# Patient Record
Sex: Female | Born: 1940 | Hispanic: Yes | Marital: Married | Smoking: Former smoker
Health system: Southern US, Community
[De-identification: ages and names within clinical notes are randomized; demographics above are authoritative.]

## PROBLEM LIST (undated history)

## (undated) DIAGNOSIS — J45909 Unspecified asthma, uncomplicated: Secondary | ICD-10-CM

## (undated) DIAGNOSIS — I10 Essential (primary) hypertension: Secondary | ICD-10-CM

## (undated) DIAGNOSIS — E785 Hyperlipidemia, unspecified: Secondary | ICD-10-CM

## (undated) HISTORY — PX: HERNIA REPAIR: SHX51

---

## 2015-06-01 ENCOUNTER — Emergency Department: Payer: Medicare Other

## 2015-06-01 ENCOUNTER — Emergency Department
Admission: EM | Admit: 2015-06-01 | Discharge: 2015-06-01 | Disposition: A | Discharge status: Home / Self Care | Payer: Medicare Other | Attending: Emergency Medicine | Admitting: Emergency Medicine

## 2015-06-01 ENCOUNTER — Encounter: Payer: Self-pay | Admitting: Emergency Medicine

## 2015-06-01 DIAGNOSIS — I10 Essential (primary) hypertension: Secondary | ICD-10-CM | POA: Diagnosis not present

## 2015-06-01 DIAGNOSIS — Y9389 Activity, other specified: Secondary | ICD-10-CM | POA: Diagnosis not present

## 2015-06-01 DIAGNOSIS — Y998 Other external cause status: Secondary | ICD-10-CM | POA: Diagnosis not present

## 2015-06-01 DIAGNOSIS — S299XXA Unspecified injury of thorax, initial encounter: Secondary | ICD-10-CM | POA: Diagnosis not present

## 2015-06-01 DIAGNOSIS — M545 Low back pain, unspecified: Secondary | ICD-10-CM

## 2015-06-01 DIAGNOSIS — S3992XA Unspecified injury of lower back, initial encounter: Secondary | ICD-10-CM | POA: Insufficient documentation

## 2015-06-01 DIAGNOSIS — Z87891 Personal history of nicotine dependence: Secondary | ICD-10-CM | POA: Insufficient documentation

## 2015-06-01 DIAGNOSIS — S34139A Unspecified injury to sacral spinal cord, initial encounter: Secondary | ICD-10-CM | POA: Diagnosis present

## 2015-06-01 DIAGNOSIS — Y9289 Other specified places as the place of occurrence of the external cause: Secondary | ICD-10-CM | POA: Insufficient documentation

## 2015-06-01 DIAGNOSIS — S3210XA Unspecified fracture of sacrum, initial encounter for closed fracture: Secondary | ICD-10-CM | POA: Insufficient documentation

## 2015-06-01 DIAGNOSIS — W109XXA Fall (on) (from) unspecified stairs and steps, initial encounter: Secondary | ICD-10-CM | POA: Diagnosis not present

## 2015-06-01 HISTORY — DX: Essential (primary) hypertension: I10

## 2015-06-01 MED ORDER — TRAMADOL HCL 50 MG PO TABS
50.0000 mg | ORAL_TABLET | Freq: Once | ORAL | Status: AC
  Administered 2015-06-01: 50 mg via ORAL
  Filled 2015-06-01: qty 1

## 2015-06-01 MED ORDER — TRAMADOL HCL 50 MG PO TABS: 50.0000 mg | ORAL_TABLET | Freq: Four times a day (QID) | ORAL | Status: DC | PRN

## 2015-06-01 NOTE — ED Notes (Signed)
Triage performed with aid of video remote interpreter.

## 2015-06-01 NOTE — ED Notes (Signed)
Pt slipped and fell yesterday on steps.  No loc.  Low back pain.  Pt ambulated in, but very slowly.

## 2015-06-01 NOTE — ED Notes (Signed)
MD at bedside w/ mobile interpreter; pt reports falling at home yesterday down 5 steps; states she slipped across frozen area.  Pt reports lower back pain, and pain across left side; pt with difficulty walking and bending at hips due to pain.

## 2015-06-01 NOTE — ED Notes (Signed)
Patient transported to X-ray 

## 2015-06-01 NOTE — ED Provider Notes (Signed)
[facility] Emergency Department Provider Note  ____________________________________________  Time seen: Approximately 10:38 AM  I have reviewed the triage vital signs and the nursing notes.   HISTORY  Chief Complaint Fall and Back Pain   HPI Jill Abbott is a 74 y.o. female who presents to the emergency department for evaluation of lower back pain after falling down about 5 stairs yesterday morning. No loss of bowel or bladder control.    Past Medical History  Diagnosis Date  . Hypertension     There are no active problems to display for this patient.   Past Surgical History  Procedure Laterality Date  . Hernia repair      Current Outpatient Rx  Name  Route  Sig  Dispense  Refill  . traMADol (ULTRAM) 50 MG tablet   Oral   Take 1 tablet (50 mg total) by mouth every 6 (six) hours as needed.   15 tablet   0     Allergies Review of patient's allergies indicates no known allergies.  No family history on file.  Social History Social History  Substance Use Topics  . Smoking status: Former Games developermoker  . Smokeless tobacco: None  . Alcohol Use: No    Review of Systems Constitutional: No recent illness. Eyes: No visual changes. ENT: No sore throat. Cardiovascular: Denies chest pain or palpitations. Respiratory: Denies shortness of breath. Gastrointestinal: No abdominal pain.  Genitourinary: Negative for dysuria. Musculoskeletal: Pain in lower back and left rib area Skin: Negative for rash. Neurological: Negative for headaches, focal weakness or numbness. 10-point ROS otherwise negative.  ____________________________________________   PHYSICAL EXAM:  VITAL SIGNS: ED Triage Vitals  Enc Vitals Group     BP 06/01/15 1018 151/57 mmHg     Pulse Rate 06/01/15 1018 91     Resp 06/01/15 1018 18     Temp 06/01/15 1018 98.6 F (37 C)     Temp Source 06/01/15 1018 Oral     SpO2 06/01/15 1018 99 %     Weight 06/01/15 1020 104 lb  (47.174 kg)     Height 06/01/15 1020 4' (1.219 m)     Head Cir --      Peak Flow --      Pain Score 06/01/15 1019 9     Pain Loc --      Pain Edu? --      Excl. in GC? --     Constitutional: Alert and oriented. Well appearing and in no acute distress. Eyes: Conjunctivae are normal. EOMI. Head: Atraumatic. Nose: No congestion/rhinnorhea. Neck: No stridor.  Respiratory: Normal respiratory effort.   Musculoskeletal: Tenderness on palpation over coccyx Neurologic:  Normal speech and language. No gross focal neurologic deficits are appreciated. Speech is normal. No gait instability. Skin:  Skin is warm, dry and intact. Atraumatic. Psychiatric: Mood and affect are normal. Speech and behavior are normal.  ____________________________________________   LABS (all labs ordered are listed, but only abnormal results are displayed)  Labs Reviewed - No data to display ____________________________________________  RADIOLOGY  CLINICAL DATA: Fall yesterday down stairs with low back pain, initial encounter  EXAM: SACRUM AND COCCYX - 2+ VIEW  COMPARISON: None.  FINDINGS: The pelvic ring is visualized is intact. The sacral ala are within normal limits. Offset is noted in the distal sacrum consistent with an acute fracture. Correlation to point tenderness is recommended.  IMPRESSION: Offset in the distal sacrum likely related to an acute fracture. Correlation to point tenderness is recommended.  Electronically Signed By: Alcide Clever M.D. On: 06/01/2015 12:14 ____________________________________________   PROCEDURES  Procedure(s) performed: None   ____________________________________________   INITIAL IMPRESSION / ASSESSMENT AND PLAN / ED COURSE  Pertinent labs & imaging results that were available during my care of the patient were reviewed by me and considered in my medical decision making (see chart for details).  Patient was advised to follow-up with the  primary care provider or orthopedics. She was advised to call schedule an appointment. She was advised to return the emergency department for symptoms that change or worsen if she is unable schedule an appointment. ____________________________________________   FINAL CLINICAL IMPRESSION(S) / ED DIAGNOSES  Final diagnoses:  Acute lumbar back pain  Sacral fracture, closed, initial encounter       Chinita Pester, FNP 06/01/15 1552  Emily Filbert, MD 06/01/15 [phone]

## 2015-06-01 NOTE — ED Notes (Signed)
Discharge instructions reviewed by MD with medical interpreter.  Pt and family verbally expressed understanding.

## 2018-02-21 ENCOUNTER — Emergency Department: Payer: Medicare Other

## 2018-02-21 ENCOUNTER — Inpatient Hospital Stay
Admission: EM | Admit: 2018-02-21 | Discharge: 2018-02-22 | DRG: 194 | Disposition: A | Discharge status: Home / Self Care | Payer: Medicare Other | Attending: Internal Medicine | Admitting: Internal Medicine

## 2018-02-21 ENCOUNTER — Other Ambulatory Visit: Payer: Self-pay

## 2018-02-21 DIAGNOSIS — I10 Essential (primary) hypertension: Secondary | ICD-10-CM | POA: Diagnosis not present

## 2018-02-21 DIAGNOSIS — J189 Pneumonia, unspecified organism: Secondary | ICD-10-CM | POA: Diagnosis present

## 2018-02-21 DIAGNOSIS — Z7982 Long term (current) use of aspirin: Secondary | ICD-10-CM | POA: Diagnosis not present

## 2018-02-21 DIAGNOSIS — R042 Hemoptysis: Secondary | ICD-10-CM | POA: Diagnosis not present

## 2018-02-21 DIAGNOSIS — J45909 Unspecified asthma, uncomplicated: Secondary | ICD-10-CM | POA: Diagnosis not present

## 2018-02-21 DIAGNOSIS — J181 Lobar pneumonia, unspecified organism: Secondary | ICD-10-CM | POA: Diagnosis present

## 2018-02-21 DIAGNOSIS — Z79899 Other long term (current) drug therapy: Secondary | ICD-10-CM | POA: Diagnosis not present

## 2018-02-21 DIAGNOSIS — E785 Hyperlipidemia, unspecified: Secondary | ICD-10-CM | POA: Diagnosis present

## 2018-02-21 DIAGNOSIS — Z87891 Personal history of nicotine dependence: Secondary | ICD-10-CM

## 2018-02-21 HISTORY — DX: Unspecified asthma, uncomplicated: J45.909

## 2018-02-21 HISTORY — DX: Hyperlipidemia, unspecified: E78.5

## 2018-02-21 LAB — CBC
HCT: 34.1 % — ABNORMAL LOW (ref 35.0–47.0)
Hemoglobin: 11.3 g/dL — ABNORMAL LOW (ref 12.0–16.0)
MCH: 29.3 pg (ref 26.0–34.0)
MCHC: 33.3 g/dL (ref 32.0–36.0)
MCV: 87.9 fL (ref 80.0–100.0)
PLATELETS: 168 10*3/uL (ref 150–440)
RBC: 3.88 MIL/uL (ref 3.80–5.20)
RDW: 13.8 % (ref 11.5–14.5)
WBC: 8.2 10*3/uL (ref 3.6–11.0)

## 2018-02-21 LAB — BASIC METABOLIC PANEL
Anion gap: 8 (ref 5–15)
BUN: 20 mg/dL (ref 8–23)
CALCIUM: 9.4 mg/dL (ref 8.9–10.3)
CO2: 28 mmol/L (ref 22–32)
CREATININE: 1.07 mg/dL — AB (ref 0.44–1.00)
Chloride: 105 mmol/L (ref 98–111)
GFR, EST AFRICAN AMERICAN: 57 mL/min — AB (ref >60)
GFR, EST NON AFRICAN AMERICAN: 49 mL/min — AB (ref >60)
Glucose, Bld: 98 mg/dL (ref 70–99)
Potassium: 3.8 mmol/L (ref 3.5–5.1)
SODIUM: 141 mmol/L (ref 135–145)

## 2018-02-21 LAB — LACTIC ACID, PLASMA: Lactic Acid, Venous: 0.8 mmol/L (ref 0.5–1.9)

## 2018-02-21 MED ORDER — BENZONATATE 100 MG PO CAPS: 200.0000 mg | ORAL_CAPSULE | Freq: Three times a day (TID) | ORAL | Status: DC | PRN

## 2018-02-21 MED ORDER — ACETAMINOPHEN 650 MG RE SUPP: 650.0000 mg | Freq: Four times a day (QID) | RECTAL | Status: DC | PRN

## 2018-02-21 MED ORDER — ENOXAPARIN SODIUM 40 MG/0.4ML ~~LOC~~ SOLN: 40.0000 mg | SUBCUTANEOUS | Status: DC

## 2018-02-21 MED ORDER — SODIUM CHLORIDE 0.9 % IV SOLN
500.0000 mg | INTRAVENOUS | Status: DC
  Administered 2018-02-21: 500 mg via INTRAVENOUS
  Filled 2018-02-21 (×2): qty 500

## 2018-02-21 MED ORDER — TIOTROPIUM BROMIDE MONOHYDRATE 18 MCG IN CAPS
18.0000 ug | ORAL_CAPSULE | Freq: Every day | RESPIRATORY_TRACT | Status: DC
  Administered 2018-02-22: 18 ug via RESPIRATORY_TRACT
  Filled 2018-02-21: qty 5

## 2018-02-21 MED ORDER — ASPIRIN EC 81 MG PO TBEC: 81.0000 mg | DELAYED_RELEASE_TABLET | Freq: Every day | ORAL | Status: DC

## 2018-02-21 MED ORDER — GUAIFENESIN-DM 100-10 MG/5ML PO SYRP: 5.0000 mL | ORAL_SOLUTION | ORAL | Status: DC | PRN

## 2018-02-21 MED ORDER — PNEUMOCOCCAL VAC POLYVALENT 25 MCG/0.5ML IJ INJ: 0.5000 mL | INJECTION | INTRAMUSCULAR | Status: DC

## 2018-02-21 MED ORDER — ONDANSETRON HCL 4 MG PO TABS: 4.0000 mg | ORAL_TABLET | Freq: Four times a day (QID) | ORAL | Status: DC | PRN

## 2018-02-21 MED ORDER — SODIUM CHLORIDE 0.9 % IV SOLN
INTRAVENOUS | Status: AC
  Administered 2018-02-21 – 2018-02-22 (×2): via INTRAVENOUS

## 2018-02-21 MED ORDER — AMLODIPINE BESYLATE 5 MG PO TABS
5.0000 mg | ORAL_TABLET | Freq: Every day | ORAL | Status: DC
  Administered 2018-02-22: 5 mg via ORAL
  Filled 2018-02-21: qty 1

## 2018-02-21 MED ORDER — ACETAMINOPHEN 325 MG PO TABS: 650.0000 mg | ORAL_TABLET | Freq: Four times a day (QID) | ORAL | Status: DC | PRN

## 2018-02-21 MED ORDER — IPRATROPIUM-ALBUTEROL 0.5-2.5 (3) MG/3ML IN SOLN: 3.0000 mL | RESPIRATORY_TRACT | Status: DC | PRN

## 2018-02-21 MED ORDER — SODIUM CHLORIDE 0.9 % IV BOLUS
1000.0000 mL | Freq: Once | INTRAVENOUS | Status: AC
  Administered 2018-02-21: 1000 mL via INTRAVENOUS

## 2018-02-21 MED ORDER — ATORVASTATIN CALCIUM 20 MG PO TABS
20.0000 mg | ORAL_TABLET | Freq: Every day | ORAL | Status: DC
  Administered 2018-02-22: 20 mg via ORAL
  Filled 2018-02-21: qty 1

## 2018-02-21 MED ORDER — IOPAMIDOL (ISOVUE-370) INJECTION 76%
75.0000 mL | Freq: Once | INTRAVENOUS | Status: AC | PRN
  Administered 2018-02-21: 75 mL via INTRAVENOUS

## 2018-02-21 MED ORDER — ONDANSETRON HCL 4 MG/2ML IJ SOLN: 4.0000 mg | Freq: Four times a day (QID) | INTRAMUSCULAR | Status: DC | PRN

## 2018-02-21 MED ORDER — SODIUM CHLORIDE 0.9 % IV SOLN
2.0000 g | INTRAVENOUS | Status: DC
  Administered 2018-02-21: 2 g via INTRAVENOUS
  Filled 2018-02-21 (×2): qty 20

## 2018-02-21 NOTE — Progress Notes (Addendum)
Patient arrived from ED and ambulated to bed. After about 10 mins she began coughing. She was coughing up blood with clots. Patient denies any shortness of breath or difficulty breathing. Patient sats normal on Ra. Patient also has rash on her back and bilateral legs. Notified MD  Anne HahnWillis of both concerns. No new orders. Family at bedside with patient.

## 2018-02-21 NOTE — H&P (Signed)
[facility] [place] [facility]   PATIENT NAME: Jill Abbott    MR#:  [id number]  DATE OF BIRTH:  09/26/1940  DATE OF ADMISSION:  02/21/2018  PRIMARY CARE PHYSICIAN: Vevelyn RoyalsBiola, Holly, MD   REQUESTING/REFERRING PHYSICIAN: Roxan Hockeyobinson  CHIEF COMPLAINT:   Chief Complaint  Patient presents with  . Hemoptysis    HISTORY OF PRESENT ILLNESS:  Jill Abbott  is a 77 y.o. female who presents with chief complaint as above.  Patient developed increased cough and subsequent hemoptysis within the last day or so.  She has not had any overt fevers, though she does state that she has felt cold for the last couple of days.  Here in the ED she was found to have right-sided pneumonia.  White blood cell count was normal, she is oxygenating well on room air.  However, given her hemoptysis hospitalist were called for admission.  Of note, patient has not had any recent travel or contact with people who have elevated tuberculosis risk, and she herself has had a fairly significant negative work-up in the past, and there was nothing seen on imaging tonight including CT imaging concerning for TB or any other etiology for her hemoptysis other than her lobar pneumonia.  PAST MEDICAL HISTORY:   Past Medical History:  Diagnosis Date  . Asthma   . HLD (hyperlipidemia)   . Hypertension      PAST SURGICAL HISTORY:   Past Surgical History:  Procedure Laterality Date  . HERNIA REPAIR       SOCIAL HISTORY:   Social History   Tobacco Use  . Smoking status: Former Smoker  Substance Use Topics  . Alcohol use: No     FAMILY HISTORY:  Family history reviewed and is non-contributory.   DRUG ALLERGIES:  No Known Allergies  MEDICATIONS AT HOME:   Prior to Admission medications   Medication Sig Start Date End Date Taking? Authorizing Provider  amLODipine (NORVASC) 5 MG tablet Take 5 mg by mouth daily. 01/17/18  Yes [provider]  aspirin 81 MG EC  tablet Take 81 mg by mouth daily. 01/17/18  Yes [provider]  atorvastatin (LIPITOR) 20 MG tablet Take 20 mg by mouth daily. 01/17/18  Yes [provider]  CALCI-CHEW 1250 (500 Ca) MG chewable tablet Chew 1 tablet by mouth 2 (two) times daily. 01/14/18  Yes [provider]  [facility] D3 1000 units capsule Take 1,000 Units by mouth 2 (two) times daily. 01/17/18  Yes [provider]  diclofenac sodium (VOLTAREN) 1 % GEL Apply 2 g topically 4 (four) times daily. 01/09/18  Yes [provider]  SPIRIVA HANDIHALER 18 MCG inhalation capsule Place 18 mcg into inhaler and inhale daily. 02/13/18  Yes [provider]    REVIEW OF SYSTEMS:  Review of Systems  Constitutional: Negative for chills, fever, malaise/fatigue and weight loss.  HENT: Negative for ear pain, hearing loss and tinnitus.   Eyes: Negative for blurred vision, double vision, pain and redness.  Respiratory: Positive for cough and hemoptysis. Negative for shortness of breath.   Cardiovascular: Negative for chest pain, palpitations, orthopnea and leg swelling.  Gastrointestinal: Negative for abdominal pain, constipation, diarrhea, nausea and vomiting.  Genitourinary: Negative for dysuria, frequency and hematuria.  Musculoskeletal: Negative for back pain, joint pain and neck pain.  Skin:       No acne, rash, or lesions  Neurological: Negative for dizziness, tremors, focal weakness and weakness.  Endo/Heme/Allergies: Negative for polydipsia. Does not  bruise/bleed easily.  Psychiatric/Behavioral: Negative for depression. The patient is not nervous/anxious and does not have insomnia.      VITAL SIGNS:   Vitals:   02/21/18 1811 02/21/18 1900 02/21/18 1930 02/21/18 2030  BP: (!) 163/84 (!) 151/79 (!) 149/70 131/66  Pulse: (!) 108 85 (!) 106 81  Resp: (!) 22 20 18  (!) 21  Temp:      TempSrc:      SpO2: 91% 94% 95% 95%  Weight:      Height:       Wt Readings from Last 3 Encounters:  02/21/18  46.3 kg  06/01/15 47.2 kg    PHYSICAL EXAMINATION:  Physical Exam  Vitals reviewed. Constitutional: She is oriented to person, place, and time. She appears well-developed and well-nourished. No distress.  HENT:  Head: Normocephalic and atraumatic.  Mouth/Throat: Oropharynx is clear and moist.  Eyes: Pupils are equal, round, and reactive to light. Conjunctivae and EOM are normal. No scleral icterus.  Neck: Normal range of motion. Neck supple. No JVD present. No thyromegaly present.  Cardiovascular: Normal rate, regular rhythm and intact distal pulses. Exam reveals no gallop and no friction rub.  No murmur heard. Respiratory: Effort normal. No respiratory distress. She has no wheezes. She has no rales.  Right middle lobe rhonchi  GI: Soft. Bowel sounds are normal. She exhibits no distension. There is no tenderness.  Musculoskeletal: Normal range of motion. She exhibits no edema.  No arthritis, no gout  Lymphadenopathy:    She has no cervical adenopathy.  Neurological: She is alert and oriented to person, place, and time. No cranial nerve deficit.  No dysarthria, no aphasia  Skin: Skin is warm and dry. No rash noted. No erythema.  Psychiatric: She has a normal mood and affect. Her behavior is normal. Judgment and thought content normal.    LABORATORY PANEL:   CBC Recent Labs  Lab 02/21/18 1615  WBC 8.2  HGB 11.3*  HCT 34.1*  PLT 168   ------------------------------------------------------------------------------------------------------------------  Chemistries  Recent Labs  Lab 02/21/18 1615  NA 141  K 3.8  CL 105  CO2 28  GLUCOSE 98  BUN 20  CREATININE 1.07*  CALCIUM 9.4   ------------------------------------------------------------------------------------------------------------------  Cardiac Enzymes No results for input(s): TROPONINI in the last 168  hours. ------------------------------------------------------------------------------------------------------------------  RADIOLOGY:  Dg Chest 2 View  Result Date: 02/21/2018 CLINICAL DATA:  Hemoptysis EXAM: CHEST - 2 VIEW COMPARISON:  06/01/2015 FINDINGS: Cardiac shadow is stable. Lungs are well aerated bilaterally. Focal increased density is noted in the right middle lobe consistent with acute pneumonia. No sizable effusion is seen. No bony abnormality is noted. IMPRESSION: Right middle lobe pneumonia. Followup PA and lateral chest X-ray is recommended in 3-4 weeks following trial of antibiotic therapy to ensure resolution and exclude underlying malignancy. Electronically Signed   By: Alcide Clever M.D.   On: 02/21/2018 16:44   Ct Angio Chest Pe W And/or Wo Contrast  Result Date: 02/21/2018 CLINICAL DATA:  Coughing of blood with chest pain. EXAM: CT ANGIOGRAPHY CHEST WITH CONTRAST TECHNIQUE: Multidetector CT imaging of the chest was performed using the standard protocol during bolus administration of intravenous contrast. Multiplanar CT image reconstructions and MIPs were obtained to evaluate the vascular anatomy. CONTRAST:  75mL ISOVUE-370 IOPAMIDOL (ISOVUE-370) INJECTION 76% COMPARISON:  February 21, 2018 chest x-ray FINDINGS: Cardiovascular: Satisfactory opacification of the pulmonary arteries to the segmental level. No evidence of pulmonary embolism. The heart size is mildly enlarged. No pericardial effusion. Mediastinum/Nodes: No enlarged mediastinal, hilar,  or axillary lymph nodes. Thyroid gland, trachea, and esophagus demonstrate no significant findings. Lungs/Pleura: There is right middle lobe pneumonia. Mild atelectasis of anterior left lung base is identified. Small calcified granulomas are identified within the left lung. There is no pleural effusion. Upper Abdomen: There is a hiatal hernia. The other visualized upper abdominal structures are unremarkable. Musculoskeletal: Degenerative joint  changes of the spine are identified. Review of the MIP images confirms the above findings. IMPRESSION: No pulmonary embolus. Right middle lobe pneumonia. Mild atelectasis of anterior left lung base. Electronically Signed   By: Sherian Rein M.D.   On: 02/21/2018 18:15    EKG:  No orders found for this or any previous visit.  IMPRESSION AND PLAN:  Principal Problem:   CAP (community acquired pneumonia) -IV antibiotics started, patient does not meet sepsis criteria, blood culture sent from the ED, other supportive treatment PRN including antitussives and duo nebs if needed Active Problems:   HTN (hypertension) -continue home medications, blood pressure is stable   Asthma -continue home dose inhalers, PRN duo nebs if needed   HLD (hyperlipidemia) -Home dose antilipid  Chart review performed and case discussed with ED provider. Labs, imaging and/or ECG reviewed by provider and discussed with patient/family. Management plans discussed with the patient and/or family.  DVT PROPHYLAXIS: SubQ lovenox   GI PROPHYLAXIS:  None  ADMISSION STATUS: Inpatient     CODE STATUS: Full  TOTAL TIME TAKING CARE OF THIS PATIENT: 45 minutes.   Marquist Binstock FIELDING 02/21/2018, 8:36 PM  [facility]  Office  [phone]  CC: Primary care physician; Vevelyn Royals, MD  Note:  This document was prepared using Dragon voice recognition software and may include unintentional dictation errors.

## 2018-02-21 NOTE — ED Provider Notes (Signed)
[facility] Emergency Department Provider Note    First MD Initiated Contact with Patient 02/21/18 1621     (approximate)  I have reviewed the triage vital signs and the nursing notes.   HISTORY  Chief Complaint Hemoptysis    HPI Jill Abbott is a 77 y.o. female a history of bronchiectasis and previous episodes of hemoptysis presents the ER with fever cough and hemoptysis.  She not on anticoagulation.  No recent travel outside the [place]  No recent antibiotic use.  Patient states that she is been coughing up several clots of blood.  Also has associated right-sided chest pain with this.  Does not wear home oxygen.    Past Medical History:  Diagnosis Date  . Asthma   . Hypertension    No family history on file. Past Surgical History:  Procedure Laterality Date  . HERNIA REPAIR     There are no active problems to display for this patient.     Prior to Admission medications   Medication Sig Start Date End Date Taking? Authorizing Provider  amLODipine (NORVASC) 5 MG tablet Take 5 mg by mouth daily. 01/17/18  Yes [provider]  aspirin 81 MG EC tablet Take 81 mg by mouth daily. 01/17/18  Yes [provider]  atorvastatin (LIPITOR) 20 MG tablet Take 20 mg by mouth daily. 01/17/18  Yes [provider]  CALCI-CHEW 1250 (500 Ca) MG chewable tablet Chew 1 tablet by mouth 2 (two) times daily. 01/14/18  Yes [provider]  [facility] D3 1000 units capsule Take 1,000 Units by mouth 2 (two) times daily. 01/17/18  Yes [provider]  diclofenac sodium (VOLTAREN) 1 % GEL Apply 2 g topically 4 (four) times daily. 01/09/18  Yes [provider]  SPIRIVA HANDIHALER 18 MCG inhalation capsule Place 18 mcg into inhaler and inhale daily. 02/13/18  Yes [provider]    Allergies Patient has no known allergies.    Social History Social History   Tobacco Use  . Smoking status: Former Smoker  Substance Use  Topics  . Alcohol use: No  . Drug use: Not on file    Review of Systems Patient denies headaches, rhinorrhea, blurry vision, numbness, shortness of breath, chest pain, edema, cough, abdominal pain, nausea, vomiting, diarrhea, dysuria, fevers, rashes or hallucinations unless otherwise stated above in HPI. ____________________________________________   PHYSICAL EXAM:  VITAL SIGNS: Vitals:   02/21/18 1900 02/21/18 1930  BP: (!) 151/79 (!) 149/70  Pulse: 85 (!) 106  Resp: 20 18  Temp:    SpO2: 94% 95%    Constitutional: Alert and oriented.  Eyes: Conjunctivae are normal.  Head: Atraumatic. Nose: No congestion/rhinnorhea. Mouth/Throat: Mucous membranes are moist.   Neck: No stridor. Painless ROM.  Cardiovascular: Normal rate, regular rhythm. Grossly normal heart sounds.  Good peripheral circulation. Respiratory: Normal respiratory effort.  No retractions. Lungs CTAB. Gastrointestinal: Soft and nontender. No distention. No abdominal bruits. No CVA tenderness. Genitourinary:  Musculoskeletal: No lower extremity tenderness nor edema.  No joint effusions. Neurologic:  Normal speech and language. No gross focal neurologic deficits are appreciated. No facial droop Skin:  Skin is warm, dry and intact. No rash noted. Psychiatric: Mood and affect are normal. Speech and behavior are normal.  ____________________________________________   LABS (all labs ordered are listed, but only abnormal results are displayed)  Results for orders placed or performed during the hospital encounter of 02/21/18 (from the past 24 hour(s))  CBC     Status:  Abnormal   Collection Time: 02/21/18  4:15 PM  Result Value Ref Range   WBC 8.2 3.6 - 11.0 K/uL   RBC 3.88 3.80 - 5.20 MIL/uL   Hemoglobin 11.3 (L) 12.0 - 16.0 g/dL   HCT 16.1 (L) 09.6 - 04.5 %   MCV 87.9 80.0 - 100.0 fL   MCH 29.3 26.0 - 34.0 pg   MCHC 33.3 32.0 - 36.0 g/dL   RDW 40.9 81.1 - 91.4 %   Platelets 168 150 - 440 K/uL  Basic  metabolic panel     Status: Abnormal   Collection Time: 02/21/18  4:15 PM  Result Value Ref Range   Sodium 141 135 - 145 mmol/L   Potassium 3.8 3.5 - 5.1 mmol/L   Chloride 105 98 - 111 mmol/L   CO2 28 22 - 32 mmol/L   Glucose, Bld 98 70 - 99 mg/dL   BUN 20 8 - 23 mg/dL   Creatinine, Ser 7.82 (H) 0.44 - 1.00 mg/dL   Calcium 9.4 8.9 - 95.6 mg/dL   GFR calc non Af Amer 49 (L) >60 mL/min   GFR calc Af Amer 57 (L) >60 mL/min   Anion gap 8 5 - 15  Lactic acid, plasma     Status: None   Collection Time: 02/21/18  5:09 PM  Result Value Ref Range   Lactic Acid, Venous 0.8 0.5 - 1.9 mmol/L   ____________________________________________ ____________________________________________  RADIOLOGY I personally reviewed all radiographic images ordered to evaluate for the above acute complaints and reviewed radiology reports and findings.  These findings were personally discussed with the patient.  Please see medical record for radiology report.  ____________________________________________   PROCEDURES  Procedure(s) performed:  Procedures    Critical Care performed: no ____________________________________________   INITIAL IMPRESSION / ASSESSMENT AND PLAN / ED COURSE  Pertinent labs & imaging results that were available during my care of the patient were reviewed by me and considered in my medical decision making (see chart for details).   DDX: Asthma, copd, CHF, pna, ptx, malignancy, Pe, anemia   Jill Abbott is a 77 y.o. who presents to the ED with persistent symptoms as described above.  Patient with more complex presentation given history of recurrent hemoptysis he was found to be TB negative as well as evidence of bronchiectasis.  Patient does have remote history of smoking.  She is currently low-grade temperature mildly tachycardic with some mild tachypnea but normotensive.  Will order CT imaging to further evaluate as she does have evidence of right lobe consolidation  given her presentation with hemoptysis certainly concerning for PE versus malignancy.  The patient will be placed on continuous pulse oximetry and telemetry for monitoring.  Laboratory evaluation will be sent to evaluate for the above complaints.     Clinical Course as of Feb 21 2029  Fri Feb 21, 2018  2130 With evidence of consolidation right lobe therefore will start IV antibiotics for community acquired pneumonia.  He had multiple AFB and sputum cultures negative for Tb.  And the amount of hemoptysis as well as mild hypoxia and persistent tachycardia with frailty factors I do believe patient would benefit from hospitalization for further evaluation and management.   [PR]    Clinical Course User Index [PR] Willy Eddy, MD     As part of my medical decision making, I reviewed the following data within the electronic MEDICAL RECORD NUMBER Nursing notes reviewed and incorporated, Labs reviewed, notes from prior ED visits and [place] Controlled  Substance Database   ____________________________________________   FINAL CLINICAL IMPRESSION(S) / ED DIAGNOSES  Final diagnoses:  Community acquired pneumonia of right middle lobe of lung (HCC)  Hemoptysis      NEW MEDICATIONS STARTED DURING THIS VISIT:  New Prescriptions   No medications on file     Note:  This document was prepared using Dragon voice recognition software and may include unintentional dictation errors.    Willy Eddy, MD 02/21/18 2030

## 2018-02-21 NOTE — ED Notes (Addendum)
MD at bedside with update and discussing POC (interpreter used)

## 2018-02-21 NOTE — ED Triage Notes (Signed)
Pt reports that she has been coughing up blood that began today. Bright red bloody sputum with cough.   Interpreter used for triage.   Denies SOB.

## 2018-02-21 NOTE — ED Notes (Signed)
Christina, RN notified that patient in room and placed on airborne precautions at this time. Pt wearing mask.

## 2018-02-21 NOTE — ED Notes (Addendum)
MD at bedside (interpreter used)

## 2018-02-22 DIAGNOSIS — J181 Lobar pneumonia, unspecified organism: Secondary | ICD-10-CM | POA: Diagnosis not present

## 2018-02-22 LAB — BASIC METABOLIC PANEL
Anion gap: 6 (ref 5–15)
BUN: 15 mg/dL (ref 8–23)
CO2: 27 mmol/L (ref 22–32)
CREATININE: 0.82 mg/dL (ref 0.44–1.00)
Calcium: 8.2 mg/dL — ABNORMAL LOW (ref 8.9–10.3)
Chloride: 108 mmol/L (ref 98–111)
GFR calc Af Amer: >60 mL/min (ref >60)
Glucose, Bld: 95 mg/dL (ref 70–99)
POTASSIUM: 3.3 mmol/L — AB (ref 3.5–5.1)
Sodium: 141 mmol/L (ref 135–145)

## 2018-02-22 LAB — CBC
HEMATOCRIT: 30.9 % — AB (ref 35.0–47.0)
Hemoglobin: 10.6 g/dL — ABNORMAL LOW (ref 12.0–16.0)
MCH: 29.8 pg (ref 26.0–34.0)
MCHC: 34.3 g/dL (ref 32.0–36.0)
MCV: 86.7 fL (ref 80.0–100.0)
PLATELETS: 150 10*3/uL (ref 150–440)
RBC: 3.57 MIL/uL — ABNORMAL LOW (ref 3.80–5.20)
RDW: 13.9 % (ref 11.5–14.5)
WBC: 6.5 10*3/uL (ref 3.6–11.0)

## 2018-02-22 MED ORDER — GUAIFENESIN-DM 100-10 MG/5ML PO SYRP: 5.0000 mL | ORAL_SOLUTION | ORAL | 0 refills | Status: DC | PRN

## 2018-02-22 MED ORDER — BENZONATATE 200 MG PO CAPS: 200.0000 mg | ORAL_CAPSULE | Freq: Three times a day (TID) | ORAL | 0 refills | Status: DC | PRN

## 2018-02-22 MED ORDER — LEVOFLOXACIN 500 MG PO TABS: 500.0000 mg | ORAL_TABLET | Freq: Every day | ORAL | 0 refills | Status: AC

## 2018-02-22 MED ORDER — POTASSIUM CHLORIDE CRYS ER 20 MEQ PO TBCR
40.0000 meq | EXTENDED_RELEASE_TABLET | Freq: Once | ORAL | Status: AC
  Administered 2018-02-22: 40 meq via ORAL
  Filled 2018-02-22: qty 2

## 2018-02-22 NOTE — Progress Notes (Signed)
DISCHARGE NOTE:  Pt given discharge instructions and notified prescriptions were [facility] to [facility]. Daughter at bedside. They both verbalized understanding. Pt wheeled to car by volunteers.

## 2018-02-22 NOTE — Discharge Summary (Addendum)
[facility] [place] at [facility]   PATIENT NAME: Jill Abbott    MR#:  [id number]  DATE OF BIRTH:  [birth year]  DATE OF ADMISSION:  02/21/2018 ADMITTING PHYSICIAN: Oralia Manisavid Willis, MD  DATE OF DISCHARGE: 02/22/2018  PRIMARY CARE PHYSICIAN: Vevelyn RoyalsBiola, Holly, MD   ADMISSION DIAGNOSIS:  Hemoptysis [R04.2] Community acquired pneumonia of right middle lobe of lung (HCC) [J18.1] Hyperlipidemia Hypertension DISCHARGE DIAGNOSIS:  Principal Problem:   CAP (community acquired pneumonia) Active Problems:   HTN (hypertension)   HLD (hyperlipidemia)   Asthma   SECONDARY DIAGNOSIS:   Past Medical History:  Diagnosis Date  . Asthma   . HLD (hyperlipidemia)   . Hypertension      ADMITTING HISTORY Jill Abbott  is a 77 y.o. female who presents with chief complaint as above.  Patient developed increased cough and subsequent hemoptysis within the last day or so.  She has not had any overt fevers, though she does state that she has felt cold for the last couple of days.  Here in the ED she was found to have right-sided pneumonia.  White blood cell count was normal, she is oxygenating well on room air.  However, given her hemoptysis hospitalist were called for admission.  Of note, patient has not had any recent travel or contact with people who have elevated tuberculosis risk, and she herself has had a fairly significant negative work-up in the past, and there was nothing seen on imaging tonight including CT imaging concerning for TB or any other etiology for her hemoptysis other than her lobar pneumonia.  HOSPITAL COURSE:  Patient was admitted to medical floor.  She was started on IV Rocephin and Zithromax antibiotics.  Patient tolerated IV antibiotics well.  Her cough and shortness of breath resolved.  She did not have any new episodes of hemoptysis.  Her hemoptysis during presentation could be secondary to pneumonia. no history of any recent travel or sick contacts  with patient with tuberculosis.  Her potassium was low and was supplemented.  Hemoglobin has been stable.  Patient will be discharged home follow-up with primary care physician in the clinic.  She will be discharged home on oral Levaquin antibiotic.  CONSULTS OBTAINED:    DRUG ALLERGIES:  No Known Allergies  DISCHARGE MEDICATIONS:   Allergies as of 02/22/2018   No Known Allergies     Medication List    TAKE these medications   amLODipine 5 MG tablet Commonly known as:  NORVASC Take 5 mg by mouth daily.   aspirin 81 MG EC tablet Take 81 mg by mouth daily.   atorvastatin 20 MG tablet Commonly known as:  LIPITOR Take 20 mg by mouth daily.   benzonatate 200 MG capsule Commonly known as:  TESSALON Take 1 capsule (200 mg total) by mouth 3 (three) times daily as needed for cough.   CALCI-CHEW 1250 (500 Ca) MG chewable tablet Generic drug:  calcium carbonate Chew 1 tablet by mouth 2 (two) times daily.   CVS D3 1000 units capsule Generic drug:  Cholecalciferol Take 1,000 Units by mouth 2 (two) times daily.   diclofenac sodium 1 % Gel Commonly known as:  VOLTAREN Apply 2 g topically 4 (four) times daily.   guaiFENesin-dextromethorphan 100-10 MG/5ML syrup Commonly known as:  ROBITUSSIN DM Take 5 mLs by mouth every 4 (four) hours as needed for cough.   levofloxacin 500 MG tablet Commonly known as:  LEVAQUIN Take 1 tablet (500 mg total) by mouth daily for 5 days.  SPIRIVA HANDIHALER 18 MCG inhalation capsule Generic drug:  tiotropium Place 18 mcg into inhaler and inhale daily.       Today  Patient seen and evaluated today No new episodes of hemoptysis Decreased cough No shortness of breath  VITAL SIGNS:  Blood pressure 121/68, pulse 79, temperature 98.1 F (36.7 C), temperature source Oral, resp. rate 18, height 4\' 11"  (1.499 m), weight 49.9 kg, SpO2 98 %.  I/O:    Intake/Output Summary (Last 24 hours) at 02/22/2018 1122 Last data filed at 02/22/2018  1013 Gross per 24 hour  Intake 1590 ml  Output -  Net 1590 ml    PHYSICAL EXAMINATION:  Physical Exam  GENERAL:  77 y.o.-year-old patient lying in the bed with no acute distress.  LUNGS: Normal breath sounds bilaterally, no wheezing, rales,rhonchi or crepitation. No use of accessory muscles of respiration.  CARDIOVASCULAR: S1, S2 normal. No murmurs, rubs, or gallops.  ABDOMEN: Soft, non-tender, non-distended. Bowel sounds present. No organomegaly or mass.  NEUROLOGIC: Moves all 4 extremities. PSYCHIATRIC: The patient is alert and oriented x 3.  SKIN: No obvious rash, lesion, or ulcer.   DATA REVIEW:   CBC Recent Labs  Lab 02/22/18 0418  WBC 6.5  HGB 10.6*  HCT 30.9*  PLT 150    Chemistries  Recent Labs  Lab 02/22/18 0418  NA 141  K 3.3*  CL 108  CO2 27  GLUCOSE 95  BUN 15  CREATININE 0.82  CALCIUM 8.2*    Cardiac Enzymes No results for input(s): TROPONINI in the last 168 hours.  Microbiology Results  Results for orders placed or performed during the hospital encounter of 02/21/18  Blood Culture (routine x 2)     Status: None (Preliminary result)   Collection Time: 02/21/18  5:09 PM  Result Value Ref Range Status   Specimen Description BLOOD LEFT ANTECUBITAL  Final   Special Requests   Final    BOTTLES DRAWN AEROBIC AND ANAEROBIC Blood Culture adequate volume   Culture   Final    NO GROWTH < 12 HOURS Performed at [facility], [address], [place], [place] [address]    Report Status PENDING  Incomplete  Blood Culture (routine x 2)     Status: None (Preliminary result)   Collection Time: 02/21/18  5:09 PM  Result Value Ref Range Status   Specimen Description BLOOD RIGHT ANTECUBITAL  Final   Special Requests   Final    BOTTLES DRAWN AEROBIC AND ANAEROBIC Blood Culture results may not be optimal due to an inadequate volume of blood received in culture bottles   Culture   Final    NO GROWTH < 12 HOURS Performed at [facility], [address], [place], [place] [address]    Report Status PENDING  Incomplete    RADIOLOGY:  Dg Chest 2 View  Result Date: 02/21/2018 CLINICAL DATA:  Hemoptysis EXAM: CHEST - 2 VIEW COMPARISON:  06/01/2015 FINDINGS: Cardiac shadow is stable. Lungs are well aerated bilaterally. Focal increased density is noted in the right middle lobe consistent with acute pneumonia. No sizable effusion is seen. No bony abnormality is noted. IMPRESSION: Right middle lobe pneumonia. Followup PA and lateral chest X-ray is recommended in 3-4 weeks following trial of antibiotic therapy to ensure resolution and exclude underlying malignancy. Electronically Signed   By: Alcide Clever M.D.   On: 02/21/2018 16:44   Ct Angio Chest Pe W And/or Wo Contrast  Result Date: 02/21/2018 CLINICAL DATA:  Coughing of blood  with chest pain. EXAM: CT ANGIOGRAPHY CHEST WITH CONTRAST TECHNIQUE: Multidetector CT imaging of the chest was performed using the standard protocol during bolus administration of intravenous contrast. Multiplanar CT image reconstructions and MIPs were obtained to evaluate the vascular anatomy. CONTRAST:  75mL ISOVUE-370 IOPAMIDOL (ISOVUE-370) INJECTION 76% COMPARISON:  February 21, 2018 chest x-ray FINDINGS: Cardiovascular: Satisfactory opacification of the pulmonary arteries to the segmental level. No evidence of pulmonary embolism. The heart size is mildly enlarged. No pericardial effusion. Mediastinum/Nodes: No enlarged mediastinal, hilar, or axillary lymph nodes. Thyroid gland, trachea, and esophagus demonstrate no significant findings. Lungs/Pleura: There is right middle lobe pneumonia. Mild atelectasis of anterior left lung base is identified. Small calcified granulomas are identified within the left lung. There is no pleural effusion. Upper Abdomen: There is a hiatal hernia. The other visualized upper abdominal structures are unremarkable. Musculoskeletal: Degenerative joint changes of the spine are identified. Review of  the MIP images confirms the above findings. IMPRESSION: No pulmonary embolus. Right middle lobe pneumonia. Mild atelectasis of anterior left lung base. Electronically Signed   By: Sherian Rein M.D.   On: 02/21/2018 18:15    Follow up with PCP in 1 week.  Management plans discussed with the patient, family and they are in agreement.  CODE STATUS: Full code    Code Status Orders  (From admission, onward)         Start     Ordered   02/21/18 2139  Full code  Continuous     02/21/18 2138        Code Status History    This patient has a current code status but no historical code status.      TOTAL TIME TAKING CARE OF THIS PATIENT ON DAY OF DISCHARGE: more than 34 minutes.   Ihor Austin M.D on 02/22/2018 at 11:22 AM  Between 7am to 6pm - Pager - [phone]  After 6pm go to [email or web address] - password [email or web address]  [facility]  Office  [phone]  CC: Primary care physician; Vevelyn Royals, MD  Note: This dictation was prepared with Dragon dictation along with smaller phrase technology. Any transcriptional errors that result from this process are unintentional.

## 2018-02-26 LAB — CULTURE, BLOOD (ROUTINE X 2)
CULTURE: NO GROWTH
Culture: NO GROWTH
Special Requests: ADEQUATE

## 2018-10-10 ENCOUNTER — Emergency Department: Payer: Medicare Other

## 2018-10-10 ENCOUNTER — Encounter: Payer: Self-pay | Admitting: Emergency Medicine

## 2018-10-10 ENCOUNTER — Inpatient Hospital Stay
Admission: EM | Admit: 2018-10-10 | Discharge: 2018-10-15 | DRG: 439 | Disposition: A | Discharge status: Home / Self Care | Payer: Medicare Other | Attending: Internal Medicine | Admitting: Internal Medicine

## 2018-10-10 ENCOUNTER — Other Ambulatory Visit: Payer: Self-pay

## 2018-10-10 DIAGNOSIS — Z87891 Personal history of nicotine dependence: Secondary | ICD-10-CM

## 2018-10-10 DIAGNOSIS — E876 Hypokalemia: Secondary | ICD-10-CM | POA: Diagnosis present

## 2018-10-10 DIAGNOSIS — N816 Rectocele: Secondary | ICD-10-CM | POA: Diagnosis not present

## 2018-10-10 DIAGNOSIS — J45909 Unspecified asthma, uncomplicated: Secondary | ICD-10-CM | POA: Diagnosis present

## 2018-10-10 DIAGNOSIS — K859 Acute pancreatitis without necrosis or infection, unspecified: Secondary | ICD-10-CM | POA: Diagnosis not present

## 2018-10-10 DIAGNOSIS — Z7951 Long term (current) use of inhaled steroids: Secondary | ICD-10-CM

## 2018-10-10 DIAGNOSIS — R1011 Right upper quadrant pain: Secondary | ICD-10-CM | POA: Diagnosis not present

## 2018-10-10 DIAGNOSIS — R52 Pain, unspecified: Secondary | ICD-10-CM

## 2018-10-10 DIAGNOSIS — K8001 Calculus of gallbladder with acute cholecystitis with obstruction: Secondary | ICD-10-CM

## 2018-10-10 DIAGNOSIS — I10 Essential (primary) hypertension: Secondary | ICD-10-CM | POA: Diagnosis present

## 2018-10-10 DIAGNOSIS — R188 Other ascites: Secondary | ICD-10-CM | POA: Diagnosis present

## 2018-10-10 DIAGNOSIS — E785 Hyperlipidemia, unspecified: Secondary | ICD-10-CM | POA: Diagnosis present

## 2018-10-10 DIAGNOSIS — Z79899 Other long term (current) drug therapy: Secondary | ICD-10-CM

## 2018-10-10 DIAGNOSIS — K4190 Unilateral femoral hernia, without obstruction or gangrene, not specified as recurrent: Secondary | ICD-10-CM | POA: Diagnosis present

## 2018-10-10 DIAGNOSIS — Z7982 Long term (current) use of aspirin: Secondary | ICD-10-CM

## 2018-10-10 DIAGNOSIS — K810 Acute cholecystitis: Secondary | ICD-10-CM | POA: Diagnosis present

## 2018-10-10 LAB — CBC WITH DIFFERENTIAL/PLATELET
Abs Immature Granulocytes: 0.07 10*3/uL (ref 0.00–0.07)
Abs Immature Granulocytes: 0.07 10*3/uL (ref 0.00–0.07)
BASOS ABS: 0 10*3/uL (ref 0.0–0.1)
BASOS PCT: 0 %
Basophils Absolute: 0 10*3/uL (ref 0.0–0.1)
Basophils Relative: 0 %
Eosinophils Absolute: 0 10*3/uL (ref 0.0–0.5)
Eosinophils Absolute: 0 10*3/uL (ref 0.0–0.5)
Eosinophils Relative: 0 %
Eosinophils Relative: 0 %
HCT: 39.5 % (ref 36.0–46.0)
HCT: 40.2 % (ref 36.0–46.0)
Hemoglobin: 13.1 g/dL (ref 12.0–15.0)
Hemoglobin: 13.3 g/dL (ref 12.0–15.0)
Immature Granulocytes: 0 %
Immature Granulocytes: 0 %
Lymphocytes Relative: 2 %
Lymphocytes Relative: 4 %
Lymphs Abs: 0.4 10*3/uL — ABNORMAL LOW (ref 0.7–4.0)
Lymphs Abs: 0.7 10*3/uL (ref 0.7–4.0)
MCH: 27.9 pg (ref 26.0–34.0)
MCH: 28.1 pg (ref 26.0–34.0)
MCHC: 33.2 g/dL (ref 30.0–36.0)
MCHC: 33.1 g/dL (ref 30.0–36.0)
MCV: 84.2 fL (ref 80.0–100.0)
MCV: 84.8 fL (ref 80.0–100.0)
Monocytes Absolute: 0.9 10*3/uL (ref 0.1–1.0)
Monocytes Absolute: 0.7 10*3/uL (ref 0.1–1.0)
Monocytes Relative: 5 %
Monocytes Relative: 4 %
NRBC: 0 % (ref 0.0–0.2)
NRBC: 0 % (ref 0.0–0.2)
Neutro Abs: 16.9 10*3/uL — ABNORMAL HIGH (ref 1.7–7.7)
Neutro Abs: 15.6 10*3/uL — ABNORMAL HIGH (ref 1.7–7.7)
Neutrophils Relative %: 93 %
Neutrophils Relative %: 92 %
Platelets: 243 10*3/uL (ref 150–400)
Platelets: 232 10*3/uL (ref 150–400)
RBC: 4.69 MIL/uL (ref 3.87–5.11)
RBC: 4.74 MIL/uL (ref 3.87–5.11)
RDW: 13.1 % (ref 11.5–15.5)
RDW: 13.2 % (ref 11.5–15.5)
WBC: 18.3 10*3/uL — AB (ref 4.0–10.5)
WBC: 17 10*3/uL — AB (ref 4.0–10.5)

## 2018-10-10 LAB — LIPASE, BLOOD
Lipase: 2699 U/L — ABNORMAL HIGH (ref 11–51)
Lipase: 1286 U/L — ABNORMAL HIGH (ref 11–51)

## 2018-10-10 LAB — LIPID PANEL
Cholesterol: 104 mg/dL (ref 0–200)
HDL: 59 mg/dL (ref >40)
LDL Cholesterol: 38 mg/dL (ref 0–99)
Total CHOL/HDL Ratio: 1.8 RATIO
Triglycerides: 35 mg/dL (ref <150)
VLDL: 7 mg/dL (ref 0–40)

## 2018-10-10 LAB — URINALYSIS, COMPLETE (UACMP) WITH MICROSCOPIC
Bilirubin Urine: NEGATIVE
Glucose, UA: 50 mg/dL — AB
Hgb urine dipstick: NEGATIVE
Ketones, ur: NEGATIVE mg/dL
Leukocytes,Ua: NEGATIVE
Nitrite: NEGATIVE
Protein, ur: 100 mg/dL — AB
Specific Gravity, Urine: 1.023 (ref 1.005–1.030)
pH: 5 (ref 5.0–8.0)

## 2018-10-10 LAB — COMPREHENSIVE METABOLIC PANEL
ALT: 196 U/L — ABNORMAL HIGH (ref 0–44)
ALT: 141 U/L — ABNORMAL HIGH (ref 0–44)
AST: 307 U/L — ABNORMAL HIGH (ref 15–41)
AST: 158 U/L — ABNORMAL HIGH (ref 15–41)
Albumin: 4.1 g/dL (ref 3.5–5.0)
Albumin: 3.5 g/dL (ref 3.5–5.0)
Alkaline Phosphatase: 100 U/L (ref 38–126)
Alkaline Phosphatase: 82 U/L (ref 38–126)
Anion gap: 11 (ref 5–15)
Anion gap: 9 (ref 5–15)
BUN: 30 mg/dL — ABNORMAL HIGH (ref 8–23)
BUN: 25 mg/dL — ABNORMAL HIGH (ref 8–23)
CO2: 27 mmol/L (ref 22–32)
CO2: 26 mmol/L (ref 22–32)
Calcium: 8.9 mg/dL (ref 8.9–10.3)
Calcium: 7.4 mg/dL — ABNORMAL LOW (ref 8.9–10.3)
Chloride: 99 mmol/L (ref 98–111)
Chloride: 103 mmol/L (ref 98–111)
Creatinine, Ser: 1.15 mg/dL — ABNORMAL HIGH (ref 0.44–1.00)
Creatinine, Ser: 0.96 mg/dL (ref 0.44–1.00)
GFR calc Af Amer: 54 mL/min — ABNORMAL LOW (ref >60)
GFR calc Af Amer: >60 mL/min (ref >60)
GFR calc non Af Amer: 47 mL/min — ABNORMAL LOW (ref >60)
GFR, EST NON AFRICAN AMERICAN: 58 mL/min — AB (ref >60)
Glucose, Bld: 229 mg/dL — ABNORMAL HIGH (ref 70–99)
Glucose, Bld: 162 mg/dL — ABNORMAL HIGH (ref 70–99)
POTASSIUM: 3.1 mmol/L — AB (ref 3.5–5.1)
Potassium: 3.6 mmol/L (ref 3.5–5.1)
Sodium: 137 mmol/L (ref 135–145)
Sodium: 138 mmol/L (ref 135–145)
Total Bilirubin: 0.8 mg/dL (ref 0.3–1.2)
Total Bilirubin: 0.6 mg/dL (ref 0.3–1.2)
Total Protein: 8.6 g/dL — ABNORMAL HIGH (ref 6.5–8.1)
Total Protein: 7.4 g/dL (ref 6.5–8.1)

## 2018-10-10 LAB — MAGNESIUM: Magnesium: 2.1 mg/dL (ref 1.7–2.4)

## 2018-10-10 LAB — LACTIC ACID, PLASMA
Lactic Acid, Venous: 2.5 mmol/L (ref 0.5–1.9)
Lactic Acid, Venous: 1.5 mmol/L (ref 0.5–1.9)

## 2018-10-10 MED ORDER — BENZONATATE 100 MG PO CAPS: 200.0000 mg | ORAL_CAPSULE | Freq: Three times a day (TID) | ORAL | Status: DC | PRN

## 2018-10-10 MED ORDER — IOHEXOL 300 MG/ML  SOLN
75.0000 mL | Freq: Once | INTRAMUSCULAR | Status: AC | PRN
  Administered 2018-10-10: 75 mL via INTRAVENOUS

## 2018-10-10 MED ORDER — ONDANSETRON HCL 4 MG PO TABS: 4.0000 mg | ORAL_TABLET | Freq: Four times a day (QID) | ORAL | Status: DC | PRN

## 2018-10-10 MED ORDER — TIOTROPIUM BROMIDE MONOHYDRATE 18 MCG IN CAPS
18.0000 ug | ORAL_CAPSULE | Freq: Every day | RESPIRATORY_TRACT | Status: DC
  Administered 2018-10-10 – 2018-10-14 (×5): 18 ug via RESPIRATORY_TRACT
  Filled 2018-10-10: qty 5

## 2018-10-10 MED ORDER — SODIUM CHLORIDE 0.9 % IV SOLN
INTRAVENOUS | Status: DC
  Administered 2018-10-10 – 2018-10-13 (×8): via INTRAVENOUS

## 2018-10-10 MED ORDER — MORPHINE SULFATE (PF) 2 MG/ML IV SOLN
2.0000 mg | Freq: Once | INTRAVENOUS | Status: AC
  Administered 2018-10-10: 2 mg via INTRAVENOUS
  Filled 2018-10-10: qty 1

## 2018-10-10 MED ORDER — ONDANSETRON HCL 4 MG/2ML IJ SOLN
4.0000 mg | Freq: Four times a day (QID) | INTRAMUSCULAR | Status: DC | PRN
  Administered 2018-10-15: 4 mg via INTRAVENOUS
  Filled 2018-10-10: qty 2

## 2018-10-10 MED ORDER — MORPHINE SULFATE (PF) 2 MG/ML IV SOLN
2.0000 mg | INTRAVENOUS | Status: DC | PRN
  Administered 2018-10-10 – 2018-10-11 (×4): 2 mg via INTRAVENOUS
  Filled 2018-10-10 (×4): qty 1

## 2018-10-10 MED ORDER — MAGNESIUM HYDROXIDE 400 MG/5ML PO SUSP
30.0000 mL | Freq: Every day | ORAL | Status: DC | PRN
  Filled 2018-10-10: qty 30

## 2018-10-10 MED ORDER — GUAIFENESIN-DM 100-10 MG/5ML PO SYRP
5.0000 mL | ORAL_SOLUTION | ORAL | Status: DC | PRN
  Filled 2018-10-10: qty 5

## 2018-10-10 MED ORDER — SODIUM CHLORIDE 0.9 % IV SOLN
1.0000 g | INTRAVENOUS | Status: DC
  Administered 2018-10-10: 1 g via INTRAVENOUS
  Filled 2018-10-10: qty 10

## 2018-10-10 MED ORDER — SODIUM CHLORIDE 0.9 % IV BOLUS
1000.0000 mL | Freq: Once | INTRAVENOUS | Status: AC
  Administered 2018-10-10: 1000 mL via INTRAVENOUS

## 2018-10-10 MED ORDER — SODIUM CHLORIDE 0.9 % IV SOLN
INTRAVENOUS | Status: DC | PRN
  Administered 2018-10-10 – 2018-10-13 (×3): 250 mL via INTRAVENOUS

## 2018-10-10 MED ORDER — PIPERACILLIN-TAZOBACTAM 3.375 G IVPB 30 MIN
3.3750 g | Freq: Once | INTRAVENOUS | Status: AC
  Administered 2018-10-10: 3.375 g via INTRAVENOUS
  Filled 2018-10-10: qty 50

## 2018-10-10 MED ORDER — PIPERACILLIN-TAZOBACTAM 3.375 G IVPB 30 MIN: 3.3750 g | Freq: Four times a day (QID) | INTRAVENOUS | Status: DC

## 2018-10-10 MED ORDER — HEPARIN SODIUM (PORCINE) 5000 UNIT/ML IJ SOLN
5000.0000 [IU] | Freq: Three times a day (TID) | INTRAMUSCULAR | Status: DC
  Administered 2018-10-10 – 2018-10-15 (×14): 5000 [IU] via SUBCUTANEOUS
  Filled 2018-10-10 (×14): qty 1

## 2018-10-10 MED ORDER — PIPERACILLIN-TAZOBACTAM 3.375 G IVPB
3.3750 g | Freq: Three times a day (TID) | INTRAVENOUS | Status: DC
  Administered 2018-10-10 – 2018-10-13 (×9): 3.375 g via INTRAVENOUS
  Filled 2018-10-10 (×9): qty 50

## 2018-10-10 MED ORDER — AMLODIPINE BESYLATE 5 MG PO TABS
5.0000 mg | ORAL_TABLET | Freq: Every day | ORAL | Status: DC
  Administered 2018-10-10 – 2018-10-15 (×5): 5 mg via ORAL
  Filled 2018-10-10 (×6): qty 1

## 2018-10-10 MED ORDER — POTASSIUM CHLORIDE CRYS ER 20 MEQ PO TBCR
40.0000 meq | EXTENDED_RELEASE_TABLET | Freq: Two times a day (BID) | ORAL | Status: DC
  Administered 2018-10-10 – 2018-10-15 (×9): 40 meq via ORAL
  Filled 2018-10-10 (×10): qty 2

## 2018-10-10 MED ORDER — ENOXAPARIN SODIUM 40 MG/0.4ML ~~LOC~~ SOLN: 40.0000 mg | SUBCUTANEOUS | Status: DC

## 2018-10-10 MED ORDER — KETOROLAC TROMETHAMINE 30 MG/ML IJ SOLN
15.0000 mg | Freq: Four times a day (QID) | INTRAMUSCULAR | Status: DC | PRN
  Administered 2018-10-10 – 2018-10-11 (×3): 15 mg via INTRAVENOUS
  Filled 2018-10-10 (×3): qty 1

## 2018-10-10 MED ORDER — ACETAMINOPHEN 650 MG RE SUPP: 650.0000 mg | Freq: Four times a day (QID) | RECTAL | Status: DC | PRN

## 2018-10-10 MED ORDER — SODIUM CHLORIDE 0.9 % IV SOLN: 500.0000 mg | INTRAVENOUS | Status: DC

## 2018-10-10 MED ORDER — POTASSIUM CHLORIDE CRYS ER 20 MEQ PO TBCR
40.0000 meq | EXTENDED_RELEASE_TABLET | Freq: Once | ORAL | Status: DC
  Filled 2018-10-10: qty 2

## 2018-10-10 MED ORDER — OXYCODONE HCL 5 MG PO TABS
5.0000 mg | ORAL_TABLET | ORAL | Status: DC | PRN
  Administered 2018-10-10 – 2018-10-14 (×5): 5 mg via ORAL
  Filled 2018-10-10 (×6): qty 1

## 2018-10-10 MED ORDER — TRAZODONE HCL 50 MG PO TABS: 25.0000 mg | ORAL_TABLET | Freq: Every evening | ORAL | Status: DC | PRN

## 2018-10-10 MED ORDER — ONDANSETRON HCL 4 MG/2ML IJ SOLN
4.0000 mg | Freq: Once | INTRAMUSCULAR | Status: AC
  Administered 2018-10-10: 4 mg via INTRAVENOUS
  Filled 2018-10-10: qty 2

## 2018-10-10 MED ORDER — IOPAMIDOL (ISOVUE-300) INJECTION 61%: 75.0000 mL | Freq: Once | INTRAVENOUS | Status: DC | PRN

## 2018-10-10 MED ORDER — ACETAMINOPHEN 325 MG PO TABS: 650.0000 mg | ORAL_TABLET | Freq: Four times a day (QID) | ORAL | Status: DC | PRN

## 2018-10-10 NOTE — H&P (Signed)
[place] [facility] [place] NAME: Jill Abbott    MR#:  [id number]  DATE OF BIRTH:  [birth year]  DATE OF ADMISSION:  10/10/2018  PRIMARY CARE PHYSICIAN: Albertina Parr, MD   REQUESTING/REFERRING PHYSICIAN: Marjean Donna, MD  CHIEF COMPLAINT:   Chief Complaint  Patient presents with   Abdominal Pain   The patient is Spanish-speaking.  Translation was provided by an ER staff but is fluent in [place]. HISTORY OF PRESENT ILLNESS:  Jill Abbott  is a 78 y.o. female with a known history of hypertension, asthma and dyslipidemia, who presented to the emergency room with acute onset of upper abdominal pain including her right upper quadrant mainly and to less extent left upper quadrant with no nausea or vomiting.  She denied any fever or chills she denied any dysuria, oliguria or hematuria or flank pain.  She admitted to cough and occasional wheezing.  She attributes the cough to allergies.  No rhinorrhea or nasal congestion or sore throat.  Upon presentation to the emergency room, her heart rate was 103 and she was afebrile with otherwise normal vital signs.  Labs were remarkable for hypokalemia of 3.1 with a blood glucose of 229 and creatinine 1.15.  Her serum lipase was 2699 and AST 307 with ALT of 196 troponin of 8.6 with albumin of 4.1.  Lactic acid was 2.5 and later came down to 1.5.  CBC showed leukocytosis of 18.3 with neutrophilia.  Urinalysis showed 100 protein with otherwise unremarkable findings.  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  Right upper quadrant ultrasound revealed thickened and edematous gallbladder wall but may reflect acute cholecystitis with no documented calculi.  There was small volume ascites in the upper quadrants that is usually not encountered with uncomplicated  cholecystitis.  The patient was given IV Rocephin and morphine sulfate as well as Zofran.  I recommended IV Zosyn and we added Zithromax given her lung findings.  She will be admitted to medical monitored bed for further evaluation and management. PAST MEDICAL HISTORY:   Past Medical History:  Diagnosis Date   Asthma    HLD (hyperlipidemia)    Hypertension     PAST SURGICAL HISTORY:   Past Surgical History:  Procedure Laterality Date   HERNIA REPAIR      SOCIAL HISTORY:   Social History   Tobacco Use   Smoking status: Former Smoker    Last attempt to quit: 07/16/2014    Years since quitting: 4.2   Smokeless tobacco: Never Used  Substance Use Topics   Alcohol use: No    FAMILY HISTORY:  No family history on file.  She denied any familial diseases.  DRUG ALLERGIES:  No Known Allergies  REVIEW OF SYSTEMS:   ROS As per history of present illness. All pertinent systems were reviewed above. Constitutional,  HEENT, cardiovascular, respiratory, GI, GU, musculoskeletal, neuro, psychiatric, endocrine,  integumentary and hematologic systems were reviewed and are otherwise  negative/unremarkable except for positive findings mentioned above in the HPI.   MEDICATIONS AT HOME:   Prior to Admission medications   Medication Sig Start Date End Date Taking? Authorizing Provider  amLODipine (NORVASC) 5 MG tablet Take 5 mg by mouth daily. 01/17/18  Yes [provider]  aspirin 81 MG EC tablet Take 81 mg by mouth daily. 01/17/18  Yes [provider]  atorvastatin (LIPITOR) 20 MG tablet Take 20 mg by mouth daily. 01/17/18  Yes [provider]  CALCI-CHEW 1250 (500 Ca) MG chewable tablet Chew 1 tablet by mouth 2 (two) times daily. 01/14/18  Yes [provider]  CVS D3 1000 units capsule Take 1,000 Units by mouth 2 (two) times daily. 01/17/18  Yes [provider]  diclofenac sodium (VOLTAREN) 1 % GEL Apply 2 g topically 4 (four) times daily.  01/09/18  Yes [provider]  SPIRIVA HANDIHALER 18 MCG inhalation capsule Place 18 mcg into inhaler and inhale daily. 02/13/18  Yes [provider]  triamterene-hydrochlorothiazide (MAXZIDE-25) 37.5-25 MG tablet Take 1 tablet by mouth daily. 08/18/18 08/18/19 Yes [provider]  Trolamine Salicylate 10 % LOTN Apply to painful area twice a day 08/18/18  Yes [provider]  benzonatate (TESSALON) 200 MG capsule Take 1 capsule (200 mg total) by mouth 3 (three) times daily as needed for cough. Patient not taking: Reported on 10/10/2018 02/22/18   Saundra Shelling, MD  guaiFENesin-dextromethorphan (ROBITUSSIN DM) 100-10 MG/5ML syrup Take 5 mLs by mouth every 4 (four) hours as needed for cough. Patient not taking: Reported on 10/10/2018 02/22/18   Saundra Shelling, MD      VITAL SIGNS:  Blood pressure 129/66, pulse 95, temperature 98.7 F (37.1 C), temperature source Oral, resp. rate 17, height 5' (1.524 m), weight 48.5 kg, SpO2 96 %.  PHYSICAL EXAMINATION:  Physical Exam  GENERAL:  78 y.o.-year-old Hispanic female patient lying in the bed with no acute distress.  EYES: Pupils equal, round, reactive to light and accommodation. No scleral icterus. Extraocular muscles intact.  HEENT: Head atraumatic, normocephalic. Oropharynx and nasopharynx clear.  NECK:  Supple, no jugular venous distention. No thyroid enlargement, no tenderness.  LUNGS: Normal breath sounds bilaterally, no wheezing, rales,rhonchi or crepitation. No use of accessory muscles of respiration.  CARDIOVASCULAR: S1, S2 normal. No murmurs, rubs, or gallops.  ABDOMEN: Soft with tender right upper quadrant with positive Murphy sign and to lesser extent tender left upper quadrant with  bowel sounds present. No organomegaly or mass.  EXTREMITIES: No pedal edema, cyanosis, or clubbing.  NEUROLOGIC: Cranial nerves II through XII are intact. Muscle strength 5/5 in all extremities. Sensation intact. Gait not checked.    PSYCHIATRIC: The patient is alert and oriented x 3.  SKIN: No obvious rash, lesion, or ulcer.   LABORATORY PANEL:   CBC Recent Labs  Lab 10/10/18 0842  WBC 17.0*  HGB 13.3  HCT 40.2  PLT 232   ------------------------------------------------------------------------------------------------------------------  Chemistries  Recent Labs  Lab 10/10/18 0842  NA 138  K 3.6  CL 103  CO2 26  GLUCOSE 162*  BUN 25*  CREATININE 0.96  CALCIUM 7.4*  MG 2.1  AST 158*  ALT 141*  ALKPHOS 82  BILITOT 0.6   ------------------------------------------------------------------------------------------------------------------  Cardiac Enzymes No results for input(s): TROPONINI in the last 168 hours. ------------------------------------------------------------------------------------------------------------------  RADIOLOGY:  Ct Abdomen Pelvis W Contrast  Result Date: 10/10/2018 CLINICAL DATA:  Pancreatitis EXAM: CT ABDOMEN AND PELVIS WITH CONTRAST TECHNIQUE: Multidetector CT imaging of the abdomen and pelvis was performed using the standard protocol following bolus administration of intravenous contrast. CONTRAST:  52m OMNIPAQUE IOHEXOL 300 MG/ML  SOLN COMPARISON:  Abdominal ultrasound FINDINGS: Lower chest: Opacity with bronchiectasis in the lingula and right middle lobe. Micronodular airspace disease in the lower lobes. These findings are centrally stable from 2019 chest CT. Hepatobiliary: Small cystic density in the right liver. No significant liver finding.Edematous  gallbladder wall. No calcified gallstone. Pancreas: Pancreatic and peripancreatic edema correlating with lipase levels. No ductal dilatation taken or organized collection. Spleen: Linear capsular calcification along the lateral spleen attributed to remote insult. Adrenals/Urinary Tract: Negative adrenals. No hydronephrosis or stone. Unremarkable bladder. Stomach/Bowel: No obstruction. No inflammatory bowel wall thickening.  Vascular/Lymphatic: No acute vascular abnormality. No mass or adenopathy. Reproductive:Negative. Other: Small volume ascites that is considered reactive. Fatty left femoral hernia. Musculoskeletal: No acute abnormalities. IMPRESSION: 1. Pancreatitis without organized collection or necrosis. 2. Small volume ascites. 3. 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 4. Fatty left femoral hernia. Electronically Signed   By: Monte Fantasia M.D.   On: 10/10/2018 06:04   US Abdomen Limited Ruq  Result Date: 10/10/2018 CLINICAL DATA:  Right upper quadrant pain for 14 hours with nausea EXAM: ULTRASOUND ABDOMEN LIMITED RIGHT UPPER QUADRANT COMPARISON:  None. FINDINGS: Gallbladder: Full gallbladder with thickened/edematous wall and pericholecystic edema. No stone is seen. There is a reportedly negative sonographic Murphy sign. Common bile duct: Diameter: 5 mm Liver: No focal lesion identified. Within normal limits in parenchymal echogenicity. Portal vein is patent on color Doppler imaging with normal direction of blood flow towards the liver. Small volume anechoic ascites seen in the upper quadrants. IMPRESSION: Thickened and edematous gallbladder wall but no documented calculi or sonographic Murphy sign. This may reflect acute cholecystitis with occult stone, but reactive thickening is also possible. There is small volume ascites in the upper quadrants which is usually not encountered with uncomplicated cholecystitis, consider abdominal CT or HIDA. Electronically Signed   By: Monte Fantasia M.D.   On: 10/10/2018 04:50      IMPRESSION AND PLAN:   #1.  Acute pancreatitis.  The patient will be admitted to medical monitored bed.  She will be kept n.p.o.  Pain management will be provided.  We will follow serial lipase levels.  2.  Acute cholecystitis.  She has subsequent sepsis.  Pain management will be provided.  General surgery consult will be obtained.  I contacted Dr. Manual Meier.  Will obtain a HIDA scan.  Will  place her on IV Zosyn.  We added IV Zithromax given her lung findings on chest CT concerning for possibly MAC infection.  3.  Hypertension.  Will continue amlodipine.  4.  Elevated LFTs.  Will hold off Lipitor.  Will follow LFTs with hydration.  5.  Dyslipidemia.  Lipitor is being held off.  6.  Asthma.  We will continue Spiriva and as needed albuterol.  7.  DVT prophylaxis.  This will be provided with subcutaneous heparin   All the records are reviewed and case discussed with ED provider. Management plans discussed with the patient, family and they are in agreement.  CODE STATUS: Full code  TOTAL TIME TAKING CARE OF THIS PATIENT: 60 minutes.    Christel Mormon M.D on 10/10/2018 at 9:20 AM  Pager - [phone]  After 6pm go to [email or web address] - Proofreader  [facility] [place] [facility]  Office  [phone]  CC: Primary care physician; Albertina Parr, MD   Note: This dictation was prepared with Dragon dictation along with smaller phrase technology. Any transcriptional errors that result from this process are unintentional.

## 2018-10-10 NOTE — ED Notes (Signed)
Patient transported to room 218

## 2018-10-10 NOTE — ED Notes (Signed)
Pt to ct at this time via stretcher.

## 2018-10-10 NOTE — ED Provider Notes (Signed)
[facility] Emergency Department Provider Note    First MD Initiated Contact with Patient 10/10/18 [phone]     (approximate)  I have reviewed the triage vital signs and the nursing notes.   HISTORY History obtained via Spanish interpreter  Chief Complaint Abdominal Pain    HPI Jill Abbott is a 78 y.o. female with below list of previous medical conditions presents to the emergency department with 1 day history of generalized abdominal pain nausea and vomiting as well as dysuria.  Patient states current pain score is 10 out of 10.  Patient denies any diarrhea.  Patient denies any cough no shortness of breath.   Past Medical History:  Diagnosis Date   Asthma    HLD (hyperlipidemia)    Hypertension     Patient Active Problem List   Diagnosis Date Noted   Acute pancreatitis 10/10/2018   HTN (hypertension) 02/21/2018   HLD (hyperlipidemia) 02/21/2018   CAP (community acquired pneumonia) 02/21/2018   Asthma 02/21/2018    Past Surgical History:  Procedure Laterality Date   HERNIA REPAIR      Prior to Admission medications   Medication Sig Start Date End Date Taking? Authorizing Provider  amLODipine (NORVASC) 5 MG tablet Take 5 mg by mouth daily. 01/17/18  Yes [provider]  aspirin 81 MG EC tablet Take 81 mg by mouth daily. 01/17/18  Yes [provider]  atorvastatin (LIPITOR) 20 MG tablet Take 20 mg by mouth daily. 01/17/18  Yes [provider]  CALCI-CHEW 1250 (500 Ca) MG chewable tablet Chew 1 tablet by mouth 2 (two) times daily. 01/14/18  Yes [provider]  [employer] D3 1000 units capsule Take 1,000 Units by mouth 2 (two) times daily. 01/17/18  Yes [provider]  diclofenac sodium (VOLTAREN) 1 % GEL Apply 2 g topically 4 (four) times daily. 01/09/18  Yes [provider]  SPIRIVA HANDIHALER 18 MCG inhalation capsule Place 18 mcg into inhaler and inhale daily. 02/13/18  Yes [provider]  triamterene-hydrochlorothiazide (MAXZIDE-25) 37.5-25 MG tablet Take 1 tablet by mouth daily. 08/18/18 08/18/19 Yes [provider]  Trolamine Salicylate 10 % LOTN Apply to painful area twice a day 08/18/18  Yes [provider]  benzonatate (TESSALON) 200 MG capsule Take 1 capsule (200 mg total) by mouth 3 (three) times daily as needed for cough. Patient not taking: Reported on 10/10/2018 02/22/18   Saundra Shelling, MD  guaiFENesin-dextromethorphan (ROBITUSSIN DM) 100-10 MG/5ML syrup Take 5 mLs by mouth every 4 (four) hours as needed for cough. Patient not taking: Reported on 10/10/2018 02/22/18   Saundra Shelling, MD    Allergies Patient has no known allergies.  No family history on file.  Social History Social History   Tobacco Use   Smoking status: Former Smoker    Last attempt to quit: 07/16/2014    Years since quitting: 4.2   Smokeless tobacco: Never Used  Substance Use Topics   Alcohol use: No   Drug use: Not on file    Review of Systems Constitutional: No fever/chills Eyes: No visual changes. ENT: No sore throat. Cardiovascular: Denies chest pain. Respiratory: Denies shortness of breath. Gastrointestinal: Positive for abdominal pain and vomiting Genitourinary: Negative for dysuria. Musculoskeletal: Negative for neck pain.  Negative for back pain. Integumentary: Negative for rash. Neurological: Negative for headaches, focal weakness or numbness.   ____________________________________________   PHYSICAL EXAM:  VITAL SIGNS: ED Triage Vitals  Enc Vitals Group     BP 10/10/18  0104 133/64     Pulse Rate 10/10/18 0104 (!) 103     Resp 10/10/18 0104 18     Temp 10/10/18 0104 98 F (36.7 C)     Temp Source 10/10/18 0104 Oral     SpO2 10/10/18 0104 98 %     Weight 10/10/18 0105 48.5 kg (107 lb)     Height 10/10/18 0105 1.524 m (5')     Head Circumference --      Peak Flow --      Pain Score 10/10/18 0105 10     Pain Loc --      Pain Edu?  --      Excl. in [place]? --     Constitutional: Alert and oriented.  Apparent discomfort Eyes: Conjunctivae are normal.  Mouth/Throat: Mucous membranes are moist.  Oropharynx non-erythematous. Neck: No stridor. Cardiovascular: Normal rate, regular rhythm. Good peripheral circulation. Grossly normal heart sounds. Respiratory: Normal respiratory effort.  No retractions. Lungs CTAB. Gastrointestinal: Generalized tenderness to palpation worse right upper quadrant and epigastrium. No distention.  Musculoskeletal: No lower extremity tenderness nor edema. No gross deformities of extremities. Neurologic:  Normal speech and language. No gross focal neurologic deficits are appreciated.  Skin:  Skin is warm, dry and intact. No rash noted. Psychiatric: Mood and affect are normal. Speech and behavior are normal.  ____________________________________________   LABS (all labs ordered are listed, but only abnormal results are displayed)  Labs Reviewed  URINALYSIS, COMPLETE (UACMP) WITH MICROSCOPIC - Abnormal; Notable for the following components:      Result Value   Color, Urine AMBER (*)    APPearance HAZY (*)    Glucose, UA 50 (*)    Protein, ur 100 (*)    Bacteria, UA RARE (*)    All other components within normal limits  CBC WITH DIFFERENTIAL/PLATELET - Abnormal; Notable for the following components:   WBC 18.3 (*)    Neutro Abs 16.9 (*)    Lymphs Abs 0.4 (*)    All other components within normal limits  COMPREHENSIVE METABOLIC PANEL - Abnormal; Notable for the following components:   Potassium 3.1 (*)    Glucose, Bld 229 (*)    BUN 30 (*)    Creatinine, Ser 1.15 (*)    Total Protein 8.6 (*)    AST 307 (*)    ALT 196 (*)    GFR calc non Af Amer 47 (*)    GFR calc Af Amer 54 (*)    All other components within normal limits  LIPASE, BLOOD - Abnormal; Notable for the following components:   Lipase 2,699 (*)    All other components within normal limits  LACTIC ACID, PLASMA - Abnormal;  Notable for the following components:   Lactic Acid, Venous 2.5 (*)    All other components within normal limits  CULTURE, BLOOD (ROUTINE X 2)  CULTURE, BLOOD (ROUTINE X 2)  URINE CULTURE  LACTIC ACID, PLASMA  MAGNESIUM  COMPREHENSIVE METABOLIC PANEL  CBC WITH DIFFERENTIAL/PLATELET   ____________________________________________  EKG  ED ECG REPORT I, [place] Rameen Quinney, the attending physician, personally viewed and interpreted this ECG.   Date: 10/10/2018  EKG Time: 2:46 AM  Rate: 88  Rhythm: Normal sinus rhythm  Axis: Normal  Intervals: Normal  ST&T Change: None  ____________________________________________  RADIOLOGY I, [place] Aidyn Sportsman, personally viewed and evaluated these images (plain radiographs) as part of my medical decision making, as well as reviewing the written report by the radiologist.  ED MD interpretation:  Pancreatitis without organized collection or necrosis noted on CT abdomen pelvis   Thickened and edematous gallbladder but no documented calculi or sonographic Murphy's  Official radiology report(s): Ct Abdomen Pelvis W Contrast  Result Date: 10/10/2018 CLINICAL DATA:  Pancreatitis EXAM: CT ABDOMEN AND PELVIS WITH CONTRAST TECHNIQUE: Multidetector CT imaging of the abdomen and pelvis was performed using the standard protocol following bolus administration of intravenous contrast. CONTRAST:  87m OMNIPAQUE IOHEXOL 300 MG/ML  SOLN COMPARISON:  Abdominal ultrasound FINDINGS: Lower chest: Opacity with bronchiectasis in the lingula and right middle lobe. Micronodular airspace disease in the lower lobes. These findings are centrally stable from 2019 chest CT. Hepatobiliary: Small cystic density in the right liver. No significant liver finding.Edematous gallbladder wall. No calcified gallstone. Pancreas: Pancreatic and peripancreatic edema correlating with lipase levels. No ductal dilatation taken or organized collection. Spleen: Linear capsular calcification along  the lateral spleen attributed to remote insult. Adrenals/Urinary Tract: Negative adrenals. No hydronephrosis or stone. Unremarkable bladder. Stomach/Bowel: No obstruction. No inflammatory bowel wall thickening. Vascular/Lymphatic: No acute vascular abnormality. No mass or adenopathy. Reproductive:Negative. Other: Small volume ascites that is considered reactive. Fatty left femoral hernia. Musculoskeletal: No acute abnormalities. IMPRESSION: 1. Pancreatitis without organized collection or necrosis. 2. Small volume ascites. 3. 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 4. Fatty left femoral hernia. Electronically Signed   By: JMonte FantasiaM.D.   On: 10/10/2018 06:04   UKoreaAbdomen Limited Ruq  Result Date: 10/10/2018 CLINICAL DATA:  Right upper quadrant pain for 14 hours with nausea EXAM: ULTRASOUND ABDOMEN LIMITED RIGHT UPPER QUADRANT COMPARISON:  None. FINDINGS: Gallbladder: Full gallbladder with thickened/edematous wall and pericholecystic edema. No stone is seen. There is a reportedly negative sonographic Murphy sign. Common bile duct: Diameter: 5 mm Liver: No focal lesion identified. Within normal limits in parenchymal echogenicity. Portal vein is patent on color Doppler imaging with normal direction of blood flow towards the liver. Small volume anechoic ascites seen in the upper quadrants. IMPRESSION: Thickened and edematous gallbladder wall but no documented calculi or sonographic Murphy sign. This may reflect acute cholecystitis with occult stone, but reactive thickening is also possible. There is small volume ascites in the upper quadrants which is usually not encountered with uncomplicated cholecystitis, consider abdominal CT or HIDA. Electronically Signed   By: JMonte FantasiaM.D.   On: 10/10/2018 04:50    ____________________________________________   PROCEDURES   Procedure(s) performed (including Critical Care):  .Critical Care Performed by: BGregor Hams MD Authorized by:  BGregor Hams MD   Critical care provider statement:    Critical care time (minutes):  45   Critical care time was exclusive of:  Separately billable procedures and treating other patients (Pancreatitis)   Critical care was necessary to treat or prevent imminent or life-threatening deterioration of the following conditions:  Sepsis   Critical care was time spent personally by me on the following activities:  Development of treatment plan with patient or surrogate, discussions with consultants, evaluation of patient's response to treatment, examination of patient, obtaining history from patient or surrogate, ordering and performing treatments and interventions, ordering and review of laboratory studies, ordering and review of radiographic studies, pulse oximetry, re-evaluation of patient's condition and review of old charts     ____________________________________________   INITIAL IMPRESSION / [place]DM / [place]/ ED COURSE  As part of my medical decision making, I reviewed the following data within the electronic MEDICAL RECORD NUMBER   78year old female presenting with above-stated history and  physical exam concerning for cholecystitis pancreatitis ascending cholangitis diverticulitis versus other potential intra-abdominal pathology.  Laboratory data notable for white blood cell count 18.3 lactic acid 2.5 lipase 2699 AST 307 ALT 196.  Patient given IV morphine with some pain improvement as well as Zofran.  Patient also given appropriate IV antibiotic therapy given concern for intra-abdominal pathology and potential sepsis.  Patient discussed with Dr. Sidney Ace for hospital admission for further evaluation and management    ____________________________________________  FINAL CLINICAL IMPRESSION(S) / ED DIAGNOSES  Final diagnoses:  RUQ pain  Acute pancreatitis, unspecified complication status, unspecified pancreatitis type  Calculus of gallbladder with acute cholecystitis and  obstruction     MEDICATIONS GIVEN DURING THIS VISIT:  Medications  iopamidol (ISOVUE-300) 61 % injection 75 mL (has no administration in time range)  amLODipine (NORVASC) tablet 5 mg (has no administration in time range)  benzonatate (TESSALON) capsule 200 mg (has no administration in time range)  guaiFENesin-dextromethorphan (ROBITUSSIN DM) 100-10 MG/5ML syrup 5 mL (has no administration in time range)  tiotropium (SPIRIVA) inhalation capsule ([facility] use ONLY) 18 mcg (has no administration in time range)  potassium chloride SA (K-DUR,KLOR-CON) CR tablet 40 mEq (has no administration in time range)  potassium chloride SA (K-DUR,KLOR-CON) CR tablet 40 mEq (has no administration in time range)  0.9 %  sodium chloride infusion (has no administration in time range)  acetaminophen (TYLENOL) tablet 650 mg (has no administration in time range)    Or  acetaminophen (TYLENOL) suppository 650 mg (has no administration in time range)  traZODone (DESYREL) tablet 25 mg (has no administration in time range)  ketorolac (TORADOL) 30 MG/ML injection 15 mg (has no administration in time range)  oxyCODONE (Oxy IR/ROXICODONE) immediate release tablet 5 mg (has no administration in time range)  magnesium hydroxide (MILK OF MAGNESIA) suspension 30 mL (has no administration in time range)  ondansetron (ZOFRAN) tablet 4 mg (has no administration in time range)    Or  ondansetron (ZOFRAN) injection 4 mg (has no administration in time range)  azithromycin (ZITHROMAX) 500 mg in sodium chloride 0.9 % 250 mL IVPB (has no administration in time range)  piperacillin-tazobactam (ZOSYN) IVPB 3.375 g (has no administration in time range)  heparin injection 5,000 Units (has no administration in time range)  morphine 2 MG/ML injection 2 mg (2 mg Intravenous Given 10/10/18 0239)  ondansetron (ZOFRAN) injection 4 mg (4 mg Intravenous Given 10/10/18 0239)  sodium chloride 0.9 % bolus 1,000 mL (1,000 mLs Intravenous New Bag/Given  10/10/18 0531)  sodium chloride 0.9 % bolus 1,000 mL (1,000 mLs Intravenous New Bag/Given 10/10/18 0355)  piperacillin-tazobactam (ZOSYN) IVPB 3.375 g (0 g Intravenous Stopped 10/10/18 0530)  iohexol (OMNIPAQUE) 300 MG/ML solution 75 mL (75 mLs Intravenous Contrast Given 10/10/18 0532)     ED Discharge Orders    None       Note:  This document was prepared using Dragon voice recognition software and may include unintentional dictation errors.   Gregor Hams, MD 10/10/18 [phone]

## 2018-10-10 NOTE — ED Notes (Signed)
ED TO INPATIENT HANDOFF REPORT  ED Nurse Name and Phone #: Raphael Gibney Name/Age/Gender Jill Abbott 78 y.o. female Room/Bed: ED15A/ED15A  Code Status   Code Status: Prior  Home/SNF/Other Home Patient oriented to: self, place, time and situation Is this baseline? Yes   Triage Complete: Triage complete  Chief Complaint Abdominal pain  Triage Note Pt reports mid abd radiating into lower back, pelvis since yesterday afternoon; V x 2, dysuria   Allergies No Known Allergies  Level of Care/Admitting Diagnosis ED Disposition    ED Disposition Condition Comment   Admit  The patient appears reasonably stabilized for admission considering the current resources, flow, and capabilities available in the ED at this time, and I doubt any other [facility] requiring further screening and/or treatment in the ED prior to admission is  present.       B Medical/Surgery History Past Medical History:  Diagnosis Date  . Asthma   . HLD (hyperlipidemia)   . Hypertension    Past Surgical History:  Procedure Laterality Date  . HERNIA REPAIR       A IV Location/Drains/Wounds Patient Lines/Drains/Airways Status   Active Line/Drains/Airways    Name:   Placement date:   Placement time:   Site:   Days:   Peripheral IV 10/10/18 Left Antecubital   10/10/18    0238    Antecubital   less than 1          Intake/Output Last 24 hours No intake or output data in the 24 hours ending 10/10/18 0534  Labs/Imaging Results for orders placed or performed during the hospital encounter of 10/10/18 (from the past 48 hour(s))  CBC with Differential     Status: Abnormal   Collection Time: 10/10/18  1:09 AM  Result Value Ref Range   WBC 18.3 (H) 4.0 - 10.5 K/uL   RBC 4.69 3.87 - 5.11 MIL/uL   Hemoglobin 13.1 12.0 - 15.0 g/dL   HCT 67.3 41.9 - 37.9 %   MCV 84.2 80.0 - 100.0 fL   MCH 27.9 26.0 - 34.0 pg   MCHC 33.2 30.0 - 36.0 g/dL   RDW 02.4 09.7 - 35.3 %   Platelets 243 150 - 400 K/uL   nRBC  0.0 0.0 - 0.2 %   Neutrophils Relative % 93 %   Neutro Abs 16.9 (H) 1.7 - 7.7 K/uL   Lymphocytes Relative 2 %   Lymphs Abs 0.4 (L) 0.7 - 4.0 K/uL   Monocytes Relative 5 %   Monocytes Absolute 0.9 0.1 - 1.0 K/uL   Eosinophils Relative 0 %   Eosinophils Absolute 0.0 0.0 - 0.5 K/uL   Basophils Relative 0 %   Basophils Absolute 0.0 0.0 - 0.1 K/uL   Immature Granulocytes 0 %   Abs Immature Granulocytes 0.07 0.00 - 0.07 K/uL    Comment: Performed at [facility], [address] [place], [place] [address]  Comprehensive metabolic panel     Status: Abnormal   Collection Time: 10/10/18  1:09 AM  Result Value Ref Range   Sodium 137 135 - 145 mmol/L   Potassium 3.1 (L) 3.5 - 5.1 mmol/L   Chloride 99 98 - 111 mmol/L   CO2 27 22 - 32 mmol/L   Glucose, Bld 229 (H) 70 - 99 mg/dL   BUN 30 (H) 8 - 23 mg/dL   Creatinine, Ser 2.68 (H) 0.44 - 1.00 mg/dL   Calcium 8.9 8.9 - 34.1 mg/dL   Total Protein 8.6 (H) 6.5 -  8.1 g/dL   Albumin 4.1 3.5 - 5.0 g/dL   AST 161 (H) 15 - 41 U/L    Comment: RESULT CONFIRMED BY MANUAL DILUTION TTG   ALT 196 (H) 0 - 44 U/L   Alkaline Phosphatase 100 38 - 126 U/L   Total Bilirubin 0.8 0.3 - 1.2 mg/dL   GFR calc non Af Amer 47 (L) >60 mL/min   GFR calc Af Amer 54 (L) >60 mL/min   Anion gap 11 5 - 15    Comment: Performed at [facility], [address] [place], [place] [address]  Lipase, blood     Status: Abnormal   Collection Time: 10/10/18  1:09 AM  Result Value Ref Range   Lipase 2,699 (H) 11 - 51 U/L    Comment: RESULT CONFIRMED BY MANUAL DILUTION SMA Performed at [facility], [address] [place], [place] [address]   Lactic acid, plasma     Status: Abnormal   Collection Time: 10/10/18  2:20 AM  Result Value Ref Range   Lactic Acid, Venous 2.5 (HH) 0.5 - 1.9 mmol/L    Comment: CRITICAL RESULT CALLED TO, READ BACK BY AND VERIFIED WITH [place] Tejon Gracie AT 1191 10/10/2018 SMA Performed at [facility], [address] [place], [place] [address]   Urinalysis, Complete w Microscopic     Status: Abnormal   Collection Time: 10/10/18  2:29 AM  Result Value Ref Range   Color, Urine AMBER (A) YELLOW    Comment: BIOCHEMICALS MAY BE AFFECTED BY COLOR   APPearance HAZY (A) CLEAR   Specific Gravity, Urine 1.023 1.005 - 1.030   pH 5.0 5.0 - 8.0   Glucose, UA 50 (A) NEGATIVE mg/dL   Hgb urine dipstick NEGATIVE NEGATIVE   Bilirubin Urine NEGATIVE NEGATIVE   Ketones, ur NEGATIVE NEGATIVE mg/dL   Protein, ur 562 (A) NEGATIVE mg/dL   Nitrite NEGATIVE NEGATIVE   Leukocytes,Ua NEGATIVE NEGATIVE   RBC / HPF 6-10 0 - 5 RBC/hpf   WBC, UA 0-5 0 - 5 WBC/hpf   Bacteria, UA RARE (A) NONE SEEN   Squamous Epithelial / LPF 0-5 0 - 5   Mucus PRESENT    Hyaline Casts, UA PRESENT     Comment: Performed at [facility], [address] [place], [place] [address]   US Abdomen Limited Ruq  Result Date: 10/10/2018 CLINICAL DATA:  Right upper quadrant pain for 14 hours with nausea EXAM: ULTRASOUND ABDOMEN LIMITED RIGHT UPPER QUADRANT COMPARISON:  None. FINDINGS: Gallbladder: Full gallbladder with thickened/edematous wall and pericholecystic edema. No stone is seen. There is a reportedly negative sonographic Murphy sign. Common bile duct: Diameter: 5 mm Liver: No focal lesion identified. Within normal limits in parenchymal echogenicity. Portal vein is patent on color Doppler imaging with normal direction of blood flow towards the liver. Small volume anechoic ascites seen in the upper quadrants. IMPRESSION: Thickened and edematous gallbladder wall but no documented calculi or sonographic Murphy sign. This may reflect acute cholecystitis with occult stone, but reactive thickening is also possible. There is small volume ascites in the upper quadrants which is usually not encountered with uncomplicated cholecystitis, consider abdominal CT or HIDA. Electronically Signed   By: Marnee Spring M.D.   On: 10/10/2018 04:50     Pending Labs Unresulted Labs (From admission, onward)    Start     Ordered   10/10/18 0221  Urine culture  ONCE - STAT,   STAT     10/10/18 0221  10/10/18 0220  Lactic acid, plasma  Now then every 2 hours,   STAT     10/10/18 0221   10/10/18 0220  Blood Culture (routine x 2)  BLOOD CULTURE X 2,   STAT     10/10/18 0221          Vitals/Pain Today's Vitals   10/10/18 0104 10/10/18 0105 10/10/18 0300  BP: 133/64  131/75  Pulse: (!) 103  87  Resp: 18  (!) 28  Temp: 98 F (36.7 C)    TempSrc: Oral    SpO2: 98%  96%  Weight:  48.5 kg   Height:  5' (1.524 m)   PainSc:  10-Worst pain ever     Isolation Precautions No active isolations  Medications Medications  cefTRIAXone (ROCEPHIN) 1 g in sodium chloride 0.9 % 100 mL IVPB (0 g Intravenous Stopped 10/10/18 0355)  iopamidol (ISOVUE-300) 61 % injection 75 mL (has no administration in time range)  morphine 2 MG/ML injection 2 mg (2 mg Intravenous Given 10/10/18 0239)  ondansetron (ZOFRAN) injection 4 mg (4 mg Intravenous Given 10/10/18 0239)  sodium chloride 0.9 % bolus 1,000 mL (1,000 mLs Intravenous New Bag/Given 10/10/18 0531)  sodium chloride 0.9 % bolus 1,000 mL (1,000 mLs Intravenous New Bag/Given 10/10/18 0355)  piperacillin-tazobactam (ZOSYN) IVPB 3.375 g (0 g Intravenous Stopped 10/10/18 0530)  iohexol (OMNIPAQUE) 300 MG/ML solution 75 mL (75 mLs Intravenous Contrast Given 10/10/18 0532)    Mobility walks Low fall risk   Focused Assessments Cardiac Assessment Handoff:  Cardiac Rhythm: Sinus tachycardia No results found for: CKTOTAL, CKMB, CKMBINDEX, TROPONINI No results found for: DDIMER Does the Patient currently have chest pain? No      R Recommendations: See Admitting Provider Note  Report given to:   Additional Notes: none

## 2018-10-10 NOTE — Progress Notes (Signed)
[facility] [place] NAME: Jill Abbott    MR#:  [id number]  DATE OF BIRTH:  [birth year]  SUBJECTIVE:   Patient admitted to the hospital due to abdominal pain nausea and vomiting and suspected to have acute pancreatitis.  Patient seen with the help of a Spanish interpreter.  Patient still complaining of some abdominal pain but no nausea vomiting.  Feels a little bit better since earlier.  REVIEW OF SYSTEMS:    Review of Systems  Constitutional: Negative for chills and fever.  HENT: Negative for congestion and tinnitus.   Eyes: Negative for blurred vision and double vision.  Respiratory: Negative for cough, shortness of breath and wheezing.   Cardiovascular: Negative for chest pain, orthopnea and PND.  Gastrointestinal: Positive for abdominal pain, nausea and vomiting. Negative for diarrhea.  Genitourinary: Negative for dysuria and hematuria.  Neurological: Negative for dizziness, sensory change and focal weakness.  All other systems reviewed and are negative.   Nutrition: NPO Tolerating Diet: No Tolerating PT: Ambulatory  DRUG ALLERGIES:  No Known Allergies  VITALS:  Blood pressure 117/60, pulse 85, temperature 97.7 F (36.5 C), temperature source Oral, resp. rate 17, height 5' (1.524 m), weight 48.5 kg, SpO2 97 %.  PHYSICAL EXAMINATION:   Physical Exam  GENERAL:  78 y.o.-year-old patient lying in bed in no acute distress.  EYES: Pupils equal, round, reactive to light and accommodation. No scleral icterus. Extraocular muscles intact.  HEENT: Head atraumatic, normocephalic. Oropharynx and nasopharynx clear.  NECK:  Supple, no jugular venous distention. No thyroid enlargement, no tenderness.  LUNGS: Normal breath sounds bilaterally, no wheezing, rales, rhonchi. No use of accessory muscles of respiration.  CARDIOVASCULAR: S1, S2 normal. No murmurs, rubs, or gallops.  ABDOMEN: Soft, Tender in RUQ, epigastric area,  nondistended. Bowel sounds present. No organomegaly or mass.  EXTREMITIES: No cyanosis, clubbing or edema b/l.    NEUROLOGIC: Cranial nerves II through XII are intact. No focal Motor or sensory deficits b/l.   PSYCHIATRIC: The patient is alert and oriented x 3.  SKIN: No obvious rash, lesion, or ulcer.    LABORATORY PANEL:   CBC Recent Labs  Lab 10/10/18 0842  WBC 17.0*  HGB 13.3  HCT 40.2  PLT 232   ------------------------------------------------------------------------------------------------------------------  Chemistries  Recent Labs  Lab 10/10/18 0842  NA 138  K 3.6  CL 103  CO2 26  GLUCOSE 162*  BUN 25*  CREATININE 0.96  CALCIUM 7.4*  MG 2.1  AST 158*  ALT 141*  ALKPHOS 82  BILITOT 0.6   ------------------------------------------------------------------------------------------------------------------  Cardiac Enzymes No results for input(s): TROPONINI in the last 168 hours. ------------------------------------------------------------------------------------------------------------------  RADIOLOGY:  Ct Abdomen Pelvis W Contrast  Result Date: 10/10/2018 CLINICAL DATA:  Pancreatitis EXAM: CT ABDOMEN AND PELVIS WITH CONTRAST TECHNIQUE: Multidetector CT imaging of the abdomen and pelvis was performed using the standard protocol following bolus administration of intravenous contrast. CONTRAST:  73m OMNIPAQUE IOHEXOL 300 MG/ML  SOLN COMPARISON:  Abdominal ultrasound FINDINGS: Lower chest: Opacity with bronchiectasis in the lingula and right middle lobe. Micronodular airspace disease in the lower lobes. These findings are centrally stable from 2019 chest CT. Hepatobiliary: Small cystic density in the right liver. No significant liver finding.Edematous gallbladder wall. No calcified gallstone. Pancreas: Pancreatic and peripancreatic edema correlating with lipase levels. No ductal dilatation taken or organized collection. Spleen: Linear capsular calcification along the  lateral spleen attributed to remote insult. Adrenals/Urinary Tract: Negative adrenals. No hydronephrosis or stone. Unremarkable  bladder. Stomach/Bowel: No obstruction. No inflammatory bowel wall thickening. Vascular/Lymphatic: No acute vascular abnormality. No mass or adenopathy. Reproductive:Negative. Other: Small volume ascites that is considered reactive. Fatty left femoral hernia. Musculoskeletal: No acute abnormalities. IMPRESSION: 1. Pancreatitis without organized collection or necrosis. 2. Small volume ascites. 3. 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 4. Fatty left femoral hernia. Electronically Signed   By: Monte Fantasia M.D.   On: 10/10/2018 06:04   US Abdomen Limited Ruq  Result Date: 10/10/2018 CLINICAL DATA:  Right upper quadrant pain for 14 hours with nausea EXAM: ULTRASOUND ABDOMEN LIMITED RIGHT UPPER QUADRANT COMPARISON:  None. FINDINGS: Gallbladder: Full gallbladder with thickened/edematous wall and pericholecystic edema. No stone is seen. There is a reportedly negative sonographic Murphy sign. Common bile duct: Diameter: 5 mm Liver: No focal lesion identified. Within normal limits in parenchymal echogenicity. Portal vein is patent on color Doppler imaging with normal direction of blood flow towards the liver. Small volume anechoic ascites seen in the upper quadrants. IMPRESSION: Thickened and edematous gallbladder wall but no documented calculi or sonographic Murphy sign. This may reflect acute cholecystitis with occult stone, but reactive thickening is also possible. There is small volume ascites in the upper quadrants which is usually not encountered with uncomplicated cholecystitis, consider abdominal CT or HIDA. Electronically Signed   By: Monte Fantasia M.D.   On: 10/10/2018 04:50     ASSESSMENT AND PLAN:   78 year old Hispanic female with past medical history of hypertension hyperlipidemia, asthma who presents to the hospital due to abdominal pain nausea and  vomiting.  1.  Acute pancreatitis-this is a cause of patient's abdominal pain nausea and vomiting.  I suspect this is likely biliary pancreatitis.  No history of alcohol abuse, patient's triglyceride levels are normal. - Patient's right upper quadrant ultrasound did not show any cholelithiasis or any evidence of acute cholecystitis. - Appreciate surgical input, continue supportive care with IV antibiotics(Zosyn) IV fluids pain control. - Follow lipase, and will advance to clear liquid diet tomorrow if patient is improving.  2.  Suspected biliary colic-this is a cause of patient's acute pancreatitis. -General surgery following and plan on surgical intervention once her pancreatitis resolves. - No acute need for surgical intervention presently.  Discussed with surgery and will discontinue HIDA scan for now. - No evidence of choledocholithiasis on abdominal ultrasound and patient's bilirubins are normal.  3.  Leukocytosis-secondary to pancreatitis. -Follow with underlying treatment.  4.  Essential HTN - cont. Norvasc.      All the records are reviewed and case discussed with Care Management/Social Worker. Management plans discussed with the patient, family and they are in agreement.  CODE STATUS: Full code  DVT Prophylaxis: Hep. SQ  TOTAL TIME TAKING CARE OF THIS PATIENT: 30 minutes.   POSSIBLE D/C IN 2-3 DAYS, DEPENDING ON CLINICAL CONDITION.   Henreitta Leber M.D on 10/10/2018 at 1:56 PM  Between 7am to 6pm - Pager - [phone]  After 6pm go to [email or web address] - Proofreader  [facility]  Office  [phone]  CC: Primary care physician; Albertina Parr, MD

## 2018-10-10 NOTE — Consult Note (Signed)
Subjective:   CC: pancreatitis  HPI:  Jill Abbott is a 78 y.o. female who is consulted by [facility] for evaluation of above cc.  Symptoms were first noted 1 day ago. Pain is sharp, sudden onset, first episode, localized to upper abdomen, no radiation.  Associated with nausea, exacerbated by palpation.  Dx with pancreatitis in ED.  Today, pain has improved but still present.       Past Medical History:  has a past medical history of Asthma, HLD (hyperlipidemia), and Hypertension.  Past Surgical History:  has a past surgical history that includes Hernia repair.laparoscopic  Family History: reviewed and not contributory to CC  Social History:  reports that she quit smoking about 4 years ago. She has never used smokeless tobacco. She reports that she does not drink alcohol. No history on file for drug.  Current Medications:  Medications Prior to Admission  Medication Sig Dispense Refill  . amLODipine (NORVASC) 5 MG tablet Take 5 mg by mouth daily.  99  . aspirin 81 MG EC tablet Take 81 mg by mouth daily.  99  . atorvastatin (LIPITOR) 20 MG tablet Take 20 mg by mouth daily.  99  . CALCI-CHEW 1250 (500 Ca) MG chewable tablet Chew 1 tablet by mouth 2 (two) times daily.  99  . CVS D3 1000 units capsule Take 1,000 Units by mouth 2 (two) times daily.  99  . diclofenac sodium (VOLTAREN) 1 % GEL Apply 2 g topically 4 (four) times daily.  11  . SPIRIVA HANDIHALER 18 MCG inhalation capsule Place 18 mcg into inhaler and inhale daily.  11  . triamterene-hydrochlorothiazide (MAXZIDE-25) 37.5-25 MG tablet Take 1 tablet by mouth daily.    Loura Pardon Salicylate 10 % LOTN Apply to painful area twice a day    . benzonatate (TESSALON) 200 MG capsule Take 1 capsule (200 mg total) by mouth 3 (three) times daily as needed for cough. (Patient not taking: Reported on 10/10/2018) 20 capsule 0  . guaiFENesin-dextromethorphan (ROBITUSSIN DM) 100-10 MG/5ML syrup Take 5 mLs by mouth every 4 (four) hours as  needed for cough. (Patient not taking: Reported on 10/10/2018) 118 mL 0    Allergies:  Allergies as of 10/10/2018  . (No Known Allergies)    ROS:  General: Denies weight loss, weight gain, fatigue, fevers, chills, and night sweats. Eyes: Denies blurry vision, double vision, eye pain, itchy eyes, and tearing. Ears: Denies hearing loss, earache, and ringing in ears. Nose: Denies sinus pain, congestion, infections, runny nose, and nosebleeds. Mouth/throat: Denies hoarseness, sore throat, bleeding gums, and difficulty swallowing. Heart: Denies chest pain, palpitations, racing heart, irregular heartbeat, leg pain or swelling, and decreased activity tolerance. Respiratory: Denies breathing difficulty, shortness of breath, wheezing, cough, and sputum. GI: Denies change in appetite, vomiting, constipation, diarrhea, and blood in stool. GU: Denies difficulty urinating, pain with urinating, urgency, frequency, blood in urine. Musculoskeletal: Denies joint stiffness, pain, swelling, muscle weakness. Skin: Denies rash, itching, mass, tumors, sores, and boils Neurologic: Denies headache, fainting, dizziness, seizures, numbness, and tingling. Psychiatric: Denies depression, anxiety, difficulty sleeping, and memory loss. Endocrine: Denies heat or cold intolerance, and increased thirst or urination. Blood/lymph: Denies easy bruising, easy bruising, and swollen glands     Objective:     BP 129/66 (BP Location: Right Arm)   Pulse 95   Temp 98.7 F (37.1 C) (Oral)   Resp 17   Ht 5' (1.524 m)   Wt 48.5 kg   SpO2 96%   BMI 20.90  kg/m    Constitutional :  alert, cooperative, appears stated age and no distress  Lymphatics/Throat:  no asymmetry, masses, or scars  Respiratory:  clear to auscultation bilaterally  Cardiovascular:  regular rate and rhythm  Gastrointestinal: soft, no guarding, but focal tenderness, moderate in epigastric and RUQ. Marland Kitchen   Musculoskeletal: Steady gait and movement  Skin:  Cool and moist, lap surgical scars  Psychiatric: Normal affect, non-agitated, not confused       LABS:  CMP Latest Ref Rng & Units 10/10/2018 10/10/2018 02/22/2018  Glucose 70 - 99 mg/dL 162(H) 229(H) 95  BUN 8 - 23 mg/dL 25(H) 30(H) 15  Creatinine 0.44 - 1.00 mg/dL 0.96 1.15(H) 0.82  Sodium 135 - 145 mmol/L 138 137 141  Potassium 3.5 - 5.1 mmol/L 3.6 3.1(L) 3.3(L)  Chloride 98 - 111 mmol/L 103 99 108  CO2 22 - 32 mmol/L 26 27 27   Calcium 8.9 - 10.3 mg/dL 7.4(L) 8.9 8.2(L)  Total Protein 6.5 - 8.1 g/dL 7.4 8.6(H) -  Total Bilirubin 0.3 - 1.2 mg/dL 0.6 0.8 -  Alkaline Phos 38 - 126 U/L 82 100 -  AST 15 - 41 U/L 158(H) 307(H) -  ALT 0 - 44 U/L 141(H) 196(H) -   CBC Latest Ref Rng & Units 10/10/2018 10/10/2018 02/22/2018  WBC 4.0 - 10.5 K/uL 17.0(H) 18.3(H) 6.5  Hemoglobin 12.0 - 15.0 g/dL 13.3 13.1 10.6(L)  Hematocrit 36.0 - 46.0 % 40.2 39.5 30.9(L)  Platelets 150 - 400 K/uL 232 243 150     RADS: CLINICAL DATA:  Pancreatitis  EXAM: CT ABDOMEN AND PELVIS WITH CONTRAST  TECHNIQUE: Multidetector CT imaging of the abdomen and pelvis was performed using the standard protocol following bolus administration of intravenous contrast.  CONTRAST:  59m OMNIPAQUE IOHEXOL 300 MG/ML  SOLN  COMPARISON:  Abdominal ultrasound  FINDINGS: Lower chest: Opacity with bronchiectasis in the lingula and right middle lobe. Micronodular airspace disease in the lower lobes. These findings are centrally stable from 2019 chest CT.  Hepatobiliary: Small cystic density in the right liver. No significant liver finding.Edematous gallbladder wall. No calcified gallstone.  Pancreas: Pancreatic and peripancreatic edema correlating with lipase levels. No ductal dilatation taken or organized collection.  Spleen: Linear capsular calcification along the lateral spleen attributed to remote insult.  Adrenals/Urinary Tract: Negative adrenals. No hydronephrosis or stone. Unremarkable  bladder.  Stomach/Bowel: No obstruction. No inflammatory bowel wall thickening.  Vascular/Lymphatic: No acute vascular abnormality. No mass or adenopathy.  Reproductive:Negative.  Other: Small volume ascites that is considered reactive. Fatty left femoral hernia.  Musculoskeletal: No acute abnormalities.  IMPRESSION: 1. Pancreatitis without organized collection or necrosis. 2. Small volume ascites. 3. 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 4. Fatty left femoral hernia.   Electronically Signed   By: JMonte FantasiaM.D.   On: 10/10/2018 06:04  Assessment:      Pancreatitis, no evidence of gallstones, no increased bilirubin level. wall thickening noted on UKorealikely reactive from pancreatitis  Plan:     Recommend judicious fluid resuscitation.   Ice chips for now since pain is still present, can potentially start clears if pain continues to improve later today.   HIDA scan will NOT change management so do not recommend it.   Recommend triglyceride levels to confirm not elevated, even though patient is on statins at home.   Recommend stopping antibiotics because doubt this is cholecystitis.  No other obvious risk factors for pancreatitis.  If triglyceride levels are normal, will still be hesitant on  pursuing lap chole during this admission, due to no sign of gallstones or even sludge, and also considering the current push to avoid surgeries due to COVID-19.  Will continue to monitor while inhouse.

## 2018-10-10 NOTE — Progress Notes (Signed)
Spoke with daughter Byrd Hesselbach on phone with interpreter. Updated daughter on patient's plan of care. Explained to daughter no visitors were allowed and encouraged daughter to call whenever there were concerns or questions.  Madie Reno, RN

## 2018-10-10 NOTE — ED Triage Notes (Signed)
Pt reports mid abd radiating into lower back, pelvis since yesterday afternoon; V x 2, dysuria

## 2018-10-10 NOTE — Progress Notes (Signed)
CODE SEPSIS - PHARMACY COMMUNICATION  **Broad Spectrum Antibiotics should be administered within 1 hour of Sepsis diagnosis**  Time Code Sepsis Called/Page Received: 0220  Antibiotics Ordered: ceftriaxone  Time of 1st antibiotic administration: 0240  Additional action taken by pharmacy:   If necessary, Name of Provider/Nurse Contacted:     Thomasene Ripple ,PharmD Clinical Pharmacist  10/10/2018  2:40 AM

## 2018-10-11 DIAGNOSIS — K859 Acute pancreatitis without necrosis or infection, unspecified: Principal | ICD-10-CM

## 2018-10-11 LAB — COMPREHENSIVE METABOLIC PANEL
ALT: 78 U/L — ABNORMAL HIGH (ref 0–44)
AST: 65 U/L — ABNORMAL HIGH (ref 15–41)
Albumin: 3.1 g/dL — ABNORMAL LOW (ref 3.5–5.0)
Alkaline Phosphatase: 57 U/L (ref 38–126)
Anion gap: 7 (ref 5–15)
BUN: 27 mg/dL — ABNORMAL HIGH (ref 8–23)
CO2: 24 mmol/L (ref 22–32)
Calcium: 6.9 mg/dL — ABNORMAL LOW (ref 8.9–10.3)
Chloride: 110 mmol/L (ref 98–111)
Creatinine, Ser: 0.93 mg/dL (ref 0.44–1.00)
GFR calc Af Amer: >60 mL/min (ref >60)
GFR calc non Af Amer: >60 mL/min (ref >60)
GLUCOSE: 97 mg/dL (ref 70–99)
Potassium: 3.7 mmol/L (ref 3.5–5.1)
Sodium: 141 mmol/L (ref 135–145)
Total Bilirubin: 1.1 mg/dL (ref 0.3–1.2)
Total Protein: 6.3 g/dL — ABNORMAL LOW (ref 6.5–8.1)

## 2018-10-11 LAB — URINE CULTURE: Culture: NO GROWTH

## 2018-10-11 LAB — CBC
HCT: 37 % (ref 36.0–46.0)
Hemoglobin: 11.9 g/dL — ABNORMAL LOW (ref 12.0–15.0)
MCH: 27.6 pg (ref 26.0–34.0)
MCHC: 32.2 g/dL (ref 30.0–36.0)
MCV: 85.8 fL (ref 80.0–100.0)
Platelets: 191 10*3/uL (ref 150–400)
RBC: 4.31 MIL/uL (ref 3.87–5.11)
RDW: 13.7 % (ref 11.5–15.5)
WBC: 16.8 10*3/uL — ABNORMAL HIGH (ref 4.0–10.5)
nRBC: 0 % (ref 0.0–0.2)

## 2018-10-11 LAB — LIPASE, BLOOD: Lipase: 346 U/L — ABNORMAL HIGH (ref 11–51)

## 2018-10-11 MED ORDER — KETOROLAC TROMETHAMINE 30 MG/ML IJ SOLN
15.0000 mg | Freq: Four times a day (QID) | INTRAMUSCULAR | Status: DC
  Administered 2018-10-11 – 2018-10-15 (×14): 15 mg via INTRAVENOUS
  Filled 2018-10-11 (×14): qty 1

## 2018-10-11 NOTE — Progress Notes (Signed)
10/11/2018  Subjective: No acute events overnight.  Patient reports this morning that her pain is a bit better, but not significantly improved.  Denies any nausea, flatus, or BM.  Denies any abdominal distention.  Vital signs: Temp:  [98 F (36.7 C)] 98 F (36.7 C) (03/28 0522) Pulse Rate:  [79-94] 85 (03/28 0849) Resp:  [20] 20 (03/27 1932) BP: (100-115)/(50-60) 100/50 (03/28 0849) SpO2:  [96 %-97 %] 96 % (03/28 0522)   Intake/Output: 03/27 0701 - 03/28 0700 In: 1806.8 [P.O.:150; I.V.:1554.3; IV Piggyback:102.5] Out: 251 [Urine:251] Last BM Date: 10/09/18  Physical Exam: Constitutional:  No acute distress Abdomen:  Soft, non-distended, with tenderness to palpation in epigastric and right upper quadrant areas.  Non-peritoneal.  Labs:  Recent Labs    10/10/18 0842 10/11/18 0555  WBC 17.0* 16.8*  HGB 13.3 11.9*  HCT 40.2 37.0  PLT 232 191   Recent Labs    10/10/18 0842 10/11/18 0555  NA 138 141  K 3.6 3.7  CL 103 110  CO2 26 24  GLUCOSE 162* 97  BUN 25* 27*  CREATININE 0.96 0.93  CALCIUM 7.4* 6.9*   No results for input(s): LABPROT, INR in the last 72 hours.  Imaging: No results found.  Assessment/Plan: This is a 78 y.o. female with pancreatitis, question of biliary etiology or not.  --Discussed with patient that her pancreas was quite inflamed on CT scan with surrounding fluid.  Not surprising that her pain has not improved much despite of lipase improving this morning.  --May give ice chips and sips of water, but discussed with patient to not drink too much as this may cause more pain. --Changed her pain medication so Toradol can be low dose scheduled to help with pain control better.   --Increased her IV fluid rate to 125 ml/hr.  She will need aggressive hydration given quite significant inflammation of her pancreas and fluid on CT. --Will continue to follow.   Howie Ill, MD [facility]

## 2018-10-11 NOTE — Progress Notes (Signed)
[place] at [place] NAME: Jill Abbott    MR#:  [id number]  DATE OF BIRTH:  [birth year]  SUBJECTIVE:  CHIEF COMPLAINT: Patient still reporting 7 out of 10 abdominal pain in the right upper quadrant but denies any nausea or vomiting.  Not feeling hungry but endorsing dry mouth  REVIEW OF SYSTEMS:  CONSTITUTIONAL: No fever, fatigue or weakness.  Endorsing dry mouth EYES: No blurred or double vision.  EARS, NOSE, AND THROAT: No tinnitus or ear pain.  RESPIRATORY: No cough, shortness of breath, wheezing or hemoptysis.  CARDIOVASCULAR: No chest pain, orthopnea, edema.  GASTROINTESTINAL: No nausea, vomiting, diarrhea, reports right-sided abdominal pain.  GENITOURINARY: No dysuria, hematuria.  ENDOCRINE: No polyuria, nocturia,  HEMATOLOGY: No anemia, easy bruising or bleeding SKIN: No rash or lesion. MUSCULOSKELETAL: No joint pain or arthritis.   NEUROLOGIC: No tingling, numbness, weakness.  PSYCHIATRY: No anxiety or depression.   DRUG ALLERGIES:  No Known Allergies  VITALS:  Blood pressure (!) 100/50, pulse 85, temperature 98 F (36.7 C), temperature source Oral, resp. rate 20, height 5' (1.524 m), weight 48.5 kg, SpO2 96 %.  PHYSICAL EXAMINATION:  GENERAL:  78 y.o.-year-old patient lying in the bed with no acute distress.  EYES: Pupils equal, round, reactive to light and accommodation. No scleral icterus. Extraocular muscles intact.  HEENT: Head atraumatic, normocephalic. Oropharynx and nasopharynx clear.  NECK:  Supple, no jugular venous distention. No thyroid enlargement, no tenderness.  LUNGS: Normal breath sounds bilaterally, no wheezing, rales,rhonchi or crepitation. No use of accessory muscles of respiration.  CARDIOVASCULAR: S1, S2 normal. No murmurs, rubs, or gallops.  ABDOMEN: Soft, nontender, nondistended. Bowel sounds present.]  Right upper quadrant and epigastric abdominal is tender no rebound  tenderness EXTREMITIES: No pedal edema, cyanosis, or clubbing.  NEUROLOGIC: Awake, alert and oriented x3 sensation intact. Gait not checked.  PSYCHIATRIC: The patient is alert and oriented x 3.  SKIN: No obvious rash, lesion, or ulcer.    LABORATORY PANEL:   CBC Recent Labs  Lab 10/11/18 0555  WBC 16.8*  HGB 11.9*  HCT 37.0  PLT 191   ------------------------------------------------------------------------------------------------------------------  Chemistries  Recent Labs  Lab 10/10/18 0842 10/11/18 0555  NA 138 141  K 3.6 3.7  CL 103 110  CO2 26 24  GLUCOSE 162* 97  BUN 25* 27*  CREATININE 0.96 0.93  CALCIUM 7.4* 6.9*  MG 2.1  --   AST 158* 65*  ALT 141* 78*  ALKPHOS 82 57  BILITOT 0.6 1.1   ------------------------------------------------------------------------------------------------------------------  Cardiac Enzymes No results for input(s): TROPONINI in the last 168 hours. ------------------------------------------------------------------------------------------------------------------  RADIOLOGY:  Ct Abdomen Pelvis W Contrast  Result Date: 10/10/2018 CLINICAL DATA:  Pancreatitis EXAM: CT ABDOMEN AND PELVIS WITH CONTRAST TECHNIQUE: Multidetector CT imaging of the abdomen and pelvis was performed using the standard protocol following bolus administration of intravenous contrast. CONTRAST:  15m OMNIPAQUE IOHEXOL 300 MG/ML  SOLN COMPARISON:  Abdominal ultrasound FINDINGS: Lower chest: Opacity with bronchiectasis in the lingula and right middle lobe. Micronodular airspace disease in the lower lobes. These findings are centrally stable from 2019 chest CT. Hepatobiliary: Small cystic density in the right liver. No significant liver finding.Edematous gallbladder wall. No calcified gallstone. Pancreas: Pancreatic and peripancreatic edema correlating with lipase levels. No ductal dilatation taken or organized collection. Spleen: Linear capsular calcification along the  lateral spleen attributed to remote insult. Adrenals/Urinary Tract: Negative adrenals. No hydronephrosis or stone. Unremarkable bladder. Stomach/Bowel: No obstruction. No inflammatory bowel wall  thickening. Vascular/Lymphatic: No acute vascular abnormality. No mass or adenopathy. Reproductive:Negative. Other: Small volume ascites that is considered reactive. Fatty left femoral hernia. Musculoskeletal: No acute abnormalities. IMPRESSION: 1. Pancreatitis without organized collection or necrosis. 2. Small volume ascites. 3. 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 4. Fatty left femoral hernia. Electronically Signed   By: Monte Fantasia M.D.   On: 10/10/2018 06:04   US Abdomen Limited Ruq  Result Date: 10/10/2018 CLINICAL DATA:  Right upper quadrant pain for 14 hours with nausea EXAM: ULTRASOUND ABDOMEN LIMITED RIGHT UPPER QUADRANT COMPARISON:  None. FINDINGS: Gallbladder: Full gallbladder with thickened/edematous wall and pericholecystic edema. No stone is seen. There is a reportedly negative sonographic Murphy sign. Common bile duct: Diameter: 5 mm Liver: No focal lesion identified. Within normal limits in parenchymal echogenicity. Portal vein is patent on color Doppler imaging with normal direction of blood flow towards the liver. Small volume anechoic ascites seen in the upper quadrants. IMPRESSION: Thickened and edematous gallbladder wall but no documented calculi or sonographic Murphy sign. This may reflect acute cholecystitis with occult stone, but reactive thickening is also possible. There is small volume ascites in the upper quadrants which is usually not encountered with uncomplicated cholecystitis, consider abdominal CT or HIDA. Electronically Signed   By: Monte Fantasia M.D.   On: 10/10/2018 04:50    EKG:   Orders placed or performed during the hospital encounter of 10/10/18  . ED EKG 12-Lead  . ED EKG 12-Lead  . EKG 12-Lead  . EKG 12-Lead    ASSESSMENT AND PLAN:   #1.  Acute  pancreatitis.   Clinically feeling better but still has 7 out of 10 epigastric and right upper quadrant abdominal pain Lipase [id number]  Triglycerides 35  Pain management as needed N.p.o. except ice chips Surgery is following and not considering any surgery at this time recommending outpatient surgery, no need of HIDA scan   2.  Acute cholecystitis suspected.  -No evidence of cholelithiasis on abdominal ultrasound and patient's bilirubin is normal   Pain management will be provided.   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  Hypertension.  Will continue amlodipine.  4.  Elevated LFTs.   Trending down Will hold off Lipitor.  Will follow LFTs with hydration.  5.  Dyslipidemia.  Lipitor is being held off.  6.  Asthma.    No exacerbation at this time we will continue Spiriva and as needed albuterol.  7.  DVT prophylaxis.  This will be provided with subcutaneous heparin     All the records are reviewed and case discussed with Care Management/Social Workerr. Management plans discussed with the patient, she is  in agreement.  CODE STATUS:   TOTAL TIME TAKING CARE OF THIS PATIENT:  36  minutes.   POSSIBLE D/C IN 2  DAYS, DEPENDING ON CLINICAL CONDITION.  Note: This dictation was prepared with Dragon dictation along with smaller phrase technology. Any transcriptional errors that result from this process are unintentional.   Nicholes Mango M.D on 10/11/2018 at 1:07 PM  Between 7am to 6pm - Pager - [phone] After 6pm go to [email or web address] - password [email or web address] [facility]  [facility]  Office  [phone]  CC: Primary care physician; Albertina Parr, MD

## 2018-10-12 LAB — COMPREHENSIVE METABOLIC PANEL
ALK PHOS: 52 U/L (ref 38–126)
ALT: 51 U/L — ABNORMAL HIGH (ref 0–44)
AST: 42 U/L — ABNORMAL HIGH (ref 15–41)
Albumin: 2.9 g/dL — ABNORMAL LOW (ref 3.5–5.0)
Anion gap: 8 (ref 5–15)
BUN: 27 mg/dL — ABNORMAL HIGH (ref 8–23)
CO2: 18 mmol/L — ABNORMAL LOW (ref 22–32)
Calcium: 6.5 mg/dL — ABNORMAL LOW (ref 8.9–10.3)
Chloride: 115 mmol/L — ABNORMAL HIGH (ref 98–111)
Creatinine, Ser: 1.06 mg/dL — ABNORMAL HIGH (ref 0.44–1.00)
GFR calc Af Amer: 59 mL/min — ABNORMAL LOW (ref >60)
GFR calc non Af Amer: 51 mL/min — ABNORMAL LOW (ref >60)
Glucose, Bld: 65 mg/dL — ABNORMAL LOW (ref 70–99)
Potassium: 4.7 mmol/L (ref 3.5–5.1)
Sodium: 141 mmol/L (ref 135–145)
Total Bilirubin: 1.3 mg/dL — ABNORMAL HIGH (ref 0.3–1.2)
Total Protein: 5.8 g/dL — ABNORMAL LOW (ref 6.5–8.1)

## 2018-10-12 LAB — CBC WITH DIFFERENTIAL/PLATELET
Abs Immature Granulocytes: 0.11 10*3/uL — ABNORMAL HIGH (ref 0.00–0.07)
Basophils Absolute: 0.1 10*3/uL (ref 0.0–0.1)
Basophils Relative: 0 %
Eosinophils Absolute: 0.1 10*3/uL (ref 0.0–0.5)
Eosinophils Relative: 1 %
HCT: 33.1 % — ABNORMAL LOW (ref 36.0–46.0)
Hemoglobin: 10.4 g/dL — ABNORMAL LOW (ref 12.0–15.0)
Immature Granulocytes: 1 %
LYMPHS PCT: 4 %
Lymphs Abs: 0.6 10*3/uL — ABNORMAL LOW (ref 0.7–4.0)
MCH: 28 pg (ref 26.0–34.0)
MCHC: 31.4 g/dL (ref 30.0–36.0)
MCV: 89 fL (ref 80.0–100.0)
Monocytes Absolute: 0.7 10*3/uL (ref 0.1–1.0)
Monocytes Relative: 5 %
Neutro Abs: 13 10*3/uL — ABNORMAL HIGH (ref 1.7–7.7)
Neutrophils Relative %: 89 %
Platelets: 156 10*3/uL (ref 150–400)
RBC: 3.72 MIL/uL — ABNORMAL LOW (ref 3.87–5.11)
RDW: 14.3 % (ref 11.5–15.5)
WBC: 14.6 10*3/uL — AB (ref 4.0–10.5)
nRBC: 0 % (ref 0.0–0.2)

## 2018-10-12 LAB — LIPASE, BLOOD: Lipase: 111 U/L — ABNORMAL HIGH (ref 11–51)

## 2018-10-12 MED ORDER — SIMETHICONE 80 MG PO CHEW
80.0000 mg | CHEWABLE_TABLET | Freq: Four times a day (QID) | ORAL | Status: DC | PRN
  Administered 2018-10-12 (×2): 80 mg via ORAL
  Filled 2018-10-12 (×3): qty 1

## 2018-10-12 NOTE — Progress Notes (Signed)
10/12/2018  Subjective: No acute events overnight.  Patient reports pain has subsided since yesterday afternoon.  Vital signs: Temp:  [97.3 F (36.3 C)-99.8 F (37.7 C)] 97.3 F (36.3 C) (03/29 0339) Pulse Rate:  [76-99] 99 (03/29 0339) Resp:  [19-20] 20 (03/29 0339) BP: (94-116)/(46-61) 94/61 (03/29 0822) SpO2:  [95 %-98 %] 98 % (03/29 0339)   Intake/Output: 03/28 0701 - 03/29 0700 In: 2912.8 [I.V.:2686.8; IV Piggyback:226] Out: 300 [Urine:300] Last BM Date: 10/10/18  Physical Exam: Constitutional: No acute distress Abdomen:  Soft, nondistended, nontender to palpation but just some soreness on palpation of epigastric and right upper quadrant areas.  Labs:  Recent Labs    10/11/18 0555 10/12/18 0440  WBC 16.8* 14.6*  HGB 11.9* 10.4*  HCT 37.0 33.1*  PLT 191 156   Recent Labs    10/11/18 0555 10/12/18 0440  NA 141 141  K 3.7 4.7  CL 110 115*  CO2 24 18*  GLUCOSE 97 65*  BUN 27* 27*  CREATININE 0.93 1.06*  CALCIUM 6.9* 6.5*   No results for input(s): LABPROT, INR in the last 72 hours.  Imaging: No results found.  Assessment/Plan: This is a 78 y.o. female with pancreatitis  --Will start clear liquid diet today. --Recommend continuing IV fluids as her creatinine is more elevated today.   --Also recommend repeating CMP tomorrow as her bilirubin is slowly rising.  This may be related to inflammation from the pancreas on the CBD, but should repeat labs tomorrow to trend.  Howie Ill, MD [facility]

## 2018-10-12 NOTE — Progress Notes (Signed)
RN notified Dr. Aleen Campi pt is feeling bloated. Per MD okay for RN to place order for simethicone Q6 PRN.

## 2018-10-12 NOTE — Progress Notes (Signed)
[place] at [place] NAME: Jill Abbott    MR#:  [id number]  DATE OF BIRTH:  1940/11/04  SUBJECTIVE:  CHIEF COMPLAINT: Patient denies any abdominal pain today, denies any nausea or vomiting.  She is on clear liquids and feeling bloated  REVIEW OF SYSTEMS:  CONSTITUTIONAL: No fever, fatigue or weakness.  Endorsing dry mouth EYES: No blurred or double vision.  EARS, NOSE, AND THROAT: No tinnitus or ear pain.  RESPIRATORY: No cough, shortness of breath, wheezing or hemoptysis.  CARDIOVASCULAR: No chest pain, orthopnea, edema.  GASTROINTESTINAL: No nausea, vomiting, diarrhea.  Denies any abdominal pain GENITOURINARY: No dysuria, hematuria.  ENDOCRINE: No polyuria, nocturia,  HEMATOLOGY: No anemia, easy bruising or bleeding SKIN: No rash or lesion. MUSCULOSKELETAL: No joint pain or arthritis.   NEUROLOGIC: No tingling, numbness, weakness.  PSYCHIATRY: No anxiety or depression.   DRUG ALLERGIES:  No Known Allergies  VITALS:  Blood pressure (!) 114/54, pulse 94, temperature 99.1 F (37.3 C), temperature source Oral, resp. rate 16, height 5' (1.524 m), weight 48.5 kg, SpO2 90 %.  PHYSICAL EXAMINATION:  GENERAL:  78 y.o.-year-old patient lying in the bed with no acute distress.  EYES: Pupils equal, round, reactive to light and accommodation. No scleral icterus. Extraocular muscles intact.  HEENT: Head atraumatic, normocephalic. Oropharynx and nasopharynx clear.  NECK:  Supple, no jugular venous distention. No thyroid enlargement, no tenderness.  LUNGS: Normal breath sounds bilaterally, no wheezing, rales,rhonchi or crepitation. No use of accessory muscles of respiration.  CARDIOVASCULAR: S1, S2 normal. No murmurs, rubs, or gallops.  ABDOMEN: Soft, nontender, some distention is present Bowel sounds present. EXTREMITIES: No pedal edema, cyanosis, or clubbing.  NEUROLOGIC: Awake, alert and oriented x3 sensation intact. Gait not  checked.  PSYCHIATRIC: The patient is alert and oriented x 3.  SKIN: No obvious rash, lesion, or ulcer.    LABORATORY PANEL:   CBC Recent Labs  Lab 10/12/18 0440  WBC 14.6*  HGB 10.4*  HCT 33.1*  PLT 156   ------------------------------------------------------------------------------------------------------------------  Chemistries  Recent Labs  Lab 10/10/18 0842  10/12/18 0440  NA 138   < > 141  K 3.6   < > 4.7  CL 103   < > 115*  CO2 26   < > 18*  GLUCOSE 162*   < > 65*  BUN 25*   < > 27*  CREATININE 0.96   < > 1.06*  CALCIUM 7.4*   < > 6.5*  MG 2.1  --   --   AST 158*   < > 42*  ALT 141*   < > 51*  ALKPHOS 82   < > 52  BILITOT 0.6   < > 1.3*   < > = values in this interval not displayed.   ------------------------------------------------------------------------------------------------------------------  Cardiac Enzymes No results for input(s): TROPONINI in the last 168 hours. ------------------------------------------------------------------------------------------------------------------  RADIOLOGY:  No results found.  EKG:   Orders placed or performed during the hospital encounter of 10/10/18  . ED EKG 12-Lead  . ED EKG 12-Lead  . EKG 12-Lead  . EKG 12-Lead    ASSESSMENT AND PLAN:   #1.  Acute pancreatitis.   Clinically feeling better  Lipase 1286-346 -111 Triglycerides 35  Pain management as needed Clear liquids  Surgery is following and not considering any surgery at this time recommending outpatient surgery, no need of HIDA scan   2.  Acute cholecystitis suspected.  -Total bilirubin is elevated, which is probably  from inflammation from the pancreas and the common bile duct, repeat CMP in a.m. -No evidence of cholelithiasis on abdominal ultrasound and patient's bilirubin is normal   Pain management will be provided.   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   Hypertension.  Will continue amlodipine.  4.  Elevated LFTs.   Trending down Will hold off Lipitor.  Will follow LFTs with hydration.  5.  Dyslipidemia.  Lipitor is being held off.  6.  Asthma.    No exacerbation at this time we will continue Spiriva and as needed albuterol.  7.  DVT prophylaxis.  This will be provided with subcutaneous heparin     All the records are reviewed and case discussed with Care Management/Social Workerr. Management plans discussed with the patient, she is  in agreement.  CODE STATUS:   TOTAL TIME TAKING CARE OF THIS PATIENT:  36  minutes.   POSSIBLE D/C IN 2  DAYS, DEPENDING ON CLINICAL CONDITION.  Note: This dictation was prepared with Dragon dictation along with smaller phrase technology. Any transcriptional errors that result from this process are unintentional.   Nicholes Mango M.D on 10/12/2018 at 3:12 PM  Between 7am to 6pm - Pager - [phone] After 6pm go to [email or web address] - password [email or web address] [facility]  [place] [facility]  Office  [phone]  CC: Primary care physician; Albertina Parr, MD

## 2018-10-12 NOTE — Progress Notes (Signed)
Interpreter Johnsie Cancel [phone]) was used to communicate with patient. Patient stated she was feeling bloated and wanted to ambulate in the hallway. Patient also requested to have ice water put in a smaller cup. Patient's requests were acknowledged and met. Patient ambulated 3 laps in the hallway and tolerated well.  Terrial Rhodes

## 2018-10-13 ENCOUNTER — Inpatient Hospital Stay: Payer: Medicare Other

## 2018-10-13 LAB — CBC WITH DIFFERENTIAL/PLATELET
Abs Immature Granulocytes: 0.14 10*3/uL — ABNORMAL HIGH (ref 0.00–0.07)
Basophils Absolute: 0 10*3/uL (ref 0.0–0.1)
Basophils Relative: 0 %
Eosinophils Absolute: 0.3 10*3/uL (ref 0.0–0.5)
Eosinophils Relative: 2 %
HCT: 33.2 % — ABNORMAL LOW (ref 36.0–46.0)
Hemoglobin: 10.4 g/dL — ABNORMAL LOW (ref 12.0–15.0)
Immature Granulocytes: 1 %
Lymphocytes Relative: 5 %
Lymphs Abs: 0.7 10*3/uL (ref 0.7–4.0)
MCH: 28.1 pg (ref 26.0–34.0)
MCHC: 31.3 g/dL (ref 30.0–36.0)
MCV: 89.7 fL (ref 80.0–100.0)
MONOS PCT: 6 %
Monocytes Absolute: 0.8 10*3/uL (ref 0.1–1.0)
Neutro Abs: 12.2 10*3/uL — ABNORMAL HIGH (ref 1.7–7.7)
Neutrophils Relative %: 86 %
Platelets: 170 10*3/uL (ref 150–400)
RBC: 3.7 MIL/uL — ABNORMAL LOW (ref 3.87–5.11)
RDW: 14.6 % (ref 11.5–15.5)
WBC: 14.2 10*3/uL — ABNORMAL HIGH (ref 4.0–10.5)
nRBC: 0 % (ref 0.0–0.2)

## 2018-10-13 LAB — COMPREHENSIVE METABOLIC PANEL
ALT: 37 U/L (ref 0–44)
ANION GAP: 11 (ref 5–15)
AST: 32 U/L (ref 15–41)
Albumin: 2.7 g/dL — ABNORMAL LOW (ref 3.5–5.0)
Alkaline Phosphatase: 55 U/L (ref 38–126)
BUN: 17 mg/dL (ref 8–23)
CO2: 16 mmol/L — ABNORMAL LOW (ref 22–32)
Calcium: 7.2 mg/dL — ABNORMAL LOW (ref 8.9–10.3)
Chloride: 114 mmol/L — ABNORMAL HIGH (ref 98–111)
Creatinine, Ser: 0.73 mg/dL (ref 0.44–1.00)
GFR calc non Af Amer: >60 mL/min (ref >60)
Glucose, Bld: 69 mg/dL — ABNORMAL LOW (ref 70–99)
Potassium: 3.8 mmol/L (ref 3.5–5.1)
Sodium: 141 mmol/L (ref 135–145)
Total Bilirubin: 1.4 mg/dL — ABNORMAL HIGH (ref 0.3–1.2)
Total Protein: 5.7 g/dL — ABNORMAL LOW (ref 6.5–8.1)

## 2018-10-13 LAB — BILIRUBIN, DIRECT: Bilirubin, Direct: 0.4 mg/dL — ABNORMAL HIGH (ref 0.0–0.2)

## 2018-10-13 LAB — LIPASE, BLOOD: LIPASE: 54 U/L — AB (ref 11–51)

## 2018-10-13 MED ORDER — ALUM & MAG HYDROXIDE-SIMETH 200-200-20 MG/5ML PO SUSP
30.0000 mL | Freq: Four times a day (QID) | ORAL | Status: DC | PRN
  Administered 2018-10-13: 30 mL via ORAL
  Filled 2018-10-13: qty 30

## 2018-10-13 NOTE — Progress Notes (Signed)
Pt was complaining of something being in her vagina. She stated she did not had it before coming to the hospital. Pt ask NT to check what it was. RN and NT [place] check her vagina and it seems like she has something prolapse. RN notified Dr. Sherryll Burger. MD stated he was going to place an OBYN consult. Will continue to assises and monitor pt.

## 2018-10-13 NOTE — Progress Notes (Signed)
Subjective:  CC: Jill Abbott is a 78 y.o. female  Hospital stay day 3,   acute pancreatitis  HPI: Hx obtained through [place], RN who interpreted conversation.  Started on clears, but started to feel bloated.  However, states pain overall is continuing to improve.  Having small BMs and urinating without any issue.  No nausea  ROS:  General: Denies weight loss, weight gain, fatigue, fevers, chills, and night sweats. Heart: Denies chest pain, palpitations, racing heart, irregular heartbeat, leg pain or swelling, and decreased activity tolerance. Respiratory: Denies breathing difficulty, shortness of breath, wheezing, cough, and sputum. GI: Denies change in appetite, heartburn, nausea, vomiting, constipation, diarrhea, and blood in stool. GU: Denies difficulty urinating, pain with urinating, urgency, frequency, blood in urine.   Objective:   Temp:  [97.8 F (36.6 C)-99.3 F (37.4 C)] 99.3 F (37.4 C) (03/30 0508) Pulse Rate:  [88-94] 94 (03/30 0825) Resp:  [16-20] 20 (03/30 0508) BP: (114-124)/(49-61) 124/61 (03/30 0825) SpO2:  [90 %-95 %] 94 % (03/30 0508)     Height: 5' (152.4 cm) Weight: 48.5 kg BMI (Calculated): 20.9   Intake/Output this shift:   Intake/Output Summary (Last 24 hours) at 10/13/2018 0837 Last data filed at 10/12/2018 2050 Gross per 24 hour  Intake 1755.69 ml  Output 200 ml  Net 1555.69 ml    Constitutional :  alert, cooperative, appears stated age and no distress  Respiratory:  clear to auscultation bilaterally  Cardiovascular:  regular rate and rhythm  Gastrointestinal: soft, minimally tender to palpation, mostly in epigastric LUQ.   Skin: Cool and moist.   Psychiatric: Normal affect, non-agitated, not confused       LABS:  CMP Latest Ref Rng & Units 10/13/2018 10/12/2018 10/11/2018  Glucose 70 - 99 mg/dL 35(W) 65(K) 97  BUN 8 - 23 mg/dL 17 81(E) 75(T)  Creatinine 0.44 - 1.00 mg/dL 7.00 1.74(B) 4.49  Sodium 135 - 145 mmol/L 141 141 141  Potassium  3.5 - 5.1 mmol/L 3.8 4.7 3.7  Chloride 98 - 111 mmol/L 114(H) 115(H) 110  CO2 22 - 32 mmol/L 16(L) 18(L) 24  Calcium 8.9 - 10.3 mg/dL 7.2(L) 6.5(L) 6.9(L)  Total Protein 6.5 - 8.1 g/dL 6.7(R) 9.1(M) 6.3(L)  Total Bilirubin 0.3 - 1.2 mg/dL 3.8(G) 1.3(H) 1.1  Alkaline Phos 38 - 126 U/L 55 52 57  AST 15 - 41 U/L 32 42(H) 65(H)  ALT 0 - 44 U/L 37 51(H) 78(H)   CBC Latest Ref Rng & Units 10/13/2018 10/12/2018 10/11/2018  WBC 4.0 - 10.5 K/uL 14.2(H) 14.6(H) 16.8(H)  Hemoglobin 12.0 - 15.0 g/dL 10.4(L) 10.4(L) 11.9(L)  Hematocrit 36.0 - 46.0 % 33.2(L) 33.1(L) 37.0  Platelets 150 - 400 K/uL 170 156 191    RADS: n/a Assessment:   Acute pancreatitis.  Slowly improving.  Recommend keeping on clears for today due to increased bloating, continue IVF.  Still having bowel movements, but may have a component of slowed motility secondary to inflammation.  T. Bili is increasing but it is indirect hyperbilirubinemia.  No need for additional workup from a gallbladder standpoint. With persistently negative workup for gallstones, and recommendations to delay non-urgent surgical cases as much as possible due to COVID-19 pandemic, will likely NOT perform lap chole during this admission.    Not sure if abx is beneficial at this point either since the gallbladder inflammation noted on initial imaging studies maybe more reactive to the pancreatitis than true cholecystitis, given absence of stones...  Will continue to monitor.

## 2018-10-13 NOTE — Progress Notes (Signed)
[place] at [place] NAME: Jill Abbott    MR#:  [id number]  DATE OF BIRTH:  01-27-1941  SUBJECTIVE:  CHIEF COMPLAINT: Feels bloated.  Afraid to drink any clear liquid.  Wants to stop intravenous fluid and antibiotic if possible.. Interpretation by Elita Quick at bedside. REVIEW OF SYSTEMS:  CONSTITUTIONAL: No fever, fatigue or weakness.  Endorsing dry mouth EYES: No blurred or double vision.  EARS, NOSE, AND THROAT: No tinnitus or ear pain.  RESPIRATORY: No cough, shortness of breath, wheezing or hemoptysis.  CARDIOVASCULAR: No chest pain, orthopnea, edema.  GASTROINTESTINAL: No nausea, vomiting, diarrhea.  Denies any abdominal pain GENITOURINARY: No dysuria, hematuria.  ENDOCRINE: No polyuria, nocturia,  HEMATOLOGY: No anemia, easy bruising or bleeding SKIN: No rash or lesion. MUSCULOSKELETAL: No joint pain or arthritis.   NEUROLOGIC: No tingling, numbness, weakness.  PSYCHIATRY: No anxiety or depression.   DRUG ALLERGIES:  No Known Allergies  VITALS:  Blood pressure 124/61, pulse 94, temperature 99.3 F (37.4 C), temperature source Oral, resp. rate 20, height 5' (1.524 m), weight 48.5 kg, SpO2 94 %.  PHYSICAL EXAMINATION:  GENERAL:  78 y.o.-year-old patient lying in the bed with no acute distress.  EYES: Pupils equal, round, reactive to light and accommodation. No scleral icterus. Extraocular muscles intact.  HEENT: Head atraumatic, normocephalic. Oropharynx and nasopharynx clear.  NECK:  Supple, no jugular venous distention. No thyroid enlargement, no tenderness.  LUNGS: Normal breath sounds bilaterally, no wheezing, rales,rhonchi or crepitation. No use of accessory muscles of respiration.  CARDIOVASCULAR: S1, S2 normal. No murmurs, rubs, or gallops.  ABDOMEN: Soft, nontender, some distention is present Bowel sounds present. EXTREMITIES: No pedal edema, cyanosis, or clubbing.  NEUROLOGIC: Awake, alert and oriented x3  sensation intact. Gait not checked.  PSYCHIATRIC: The patient is alert and oriented x 3.  SKIN: No obvious rash, lesion, or ulcer.  LABORATORY PANEL:   CBC Recent Labs  Lab 10/13/18 0431  WBC 14.2*  HGB 10.4*  HCT 33.2*  PLT 170   ------------------------------------------------------------------------------------------------------------------  Chemistries  Recent Labs  Lab 10/10/18 0842  10/13/18 0431  NA 138   < > 141  K 3.6   < > 3.8  CL 103   < > 114*  CO2 26   < > 16*  GLUCOSE 162*   < > 69*  BUN 25*   < > 17  CREATININE 0.96   < > 0.73  CALCIUM 7.4*   < > 7.2*  MG 2.1  --   --   AST 158*   < > 32  ALT 141*   < > 37  ALKPHOS 82   < > 55  BILITOT 0.6   < > 1.4*   < > = values in this interval not displayed.   ------------------------------------------------------------------------------------------------------------------  Cardiac Enzymes No results for input(s): TROPONINI in the last 168 hours. ------------------------------------------------------------------------------------------------------------------  RADIOLOGY:  Dg Abd 1 View  Result Date: 10/13/2018 CLINICAL DATA:  Abdominal pain. EXAM: ABDOMEN - 1 VIEW COMPARISON:  CT scan of October 10, 2018. FINDINGS: The bowel gas pattern is normal. No radio-opaque calculi or other significant radiographic abnormality are seen. IMPRESSION: No evidence of bowel obstruction or ileus. Electronically Signed   By: Marijo Conception, M.D.   On: 10/13/2018 08:39   EKG:   Orders placed or performed during the hospital encounter of 10/10/18  . ED EKG 12-Lead  . ED EKG 12-Lead  . EKG 12-Lead  . EKG 12-Lead  ASSESSMENT AND PLAN:   #1.  Acute pancreatitis.  -Stop IV fluids and antibiotic at this time. Clinically feeling better  Lipase 1286-346 -111-54 Triglycerides 35  Pain management as needed Clear liquids -she is afraid to drink as she feels she will start having more pain. Surgery is following and not  considering any surgery at this time recommending outpatient surgery, no need of HIDA scan  2.  Acute cholecystitis suspected.  -Total bilirubin is elevated, which is probably from inflammation from the pancreas and the common bile duct, repeat CMP in a.m. -No evidence of cholelithiasis on abdominal ultrasound and patient's bilirubin is normal   Pain management will be provided.   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  Hypertension.  continue amlodipine.  4.  Elevated LFTs.   Trending down Will hold off Lipitor.  Will follow LFTs with hydration.  5.  Dyslipidemia.  Lipitor is being held off.  6.  Asthma.    No exacerbation at this time we will continue Spiriva and as needed albuterol.  7.  DVT prophylaxis.  This will be provided with subcutaneous heparin     All the records are reviewed and case discussed with Care Management/Social Worker. Management plans discussed with the patient, (family over phone) she is  in agreement.  CODE STATUS:   TOTAL TIME TAKING CARE OF THIS PATIENT:  36  minutes.   POSSIBLE D/C IN 2-3  DAYS, DEPENDING ON CLINICAL CONDITION.  Note: This dictation was prepared with Dragon dictation along with smaller phrase technology. Any transcriptional errors that result from this process are unintentional.   Max Sane M.D on 10/13/2018 at 1:00 PM  Between 7am to 6pm - Pager - [phone] After 6pm go to [email or web address] - password [email or web address] [facility]  [place] [facility]  Office  [phone]  CC: Primary care physician; Albertina Parr, MD

## 2018-10-13 NOTE — Care Management Important Message (Signed)
Important Message  Patient Details  Name: Jill Abbott MRN: [id number] Date of Birth: September 08, 1940   Medicare Important Message Given:  Yes    Johnell Comings 10/13/2018, 3:06 PM

## 2018-10-13 NOTE — Progress Notes (Signed)
Per Dr. Tonna Boehringer okay for RN to change diet order to full liquids.

## 2018-10-14 DIAGNOSIS — N816 Rectocele: Secondary | ICD-10-CM

## 2018-10-14 LAB — COMPREHENSIVE METABOLIC PANEL
ALT: 32 U/L (ref 0–44)
AST: 29 U/L (ref 15–41)
Albumin: 2.6 g/dL — ABNORMAL LOW (ref 3.5–5.0)
Alkaline Phosphatase: 72 U/L (ref 38–126)
Anion gap: 8 (ref 5–15)
BUN: 9 mg/dL (ref 8–23)
CO2: 19 mmol/L — ABNORMAL LOW (ref 22–32)
Calcium: 7.9 mg/dL — ABNORMAL LOW (ref 8.9–10.3)
Chloride: 111 mmol/L (ref 98–111)
Creatinine, Ser: 0.65 mg/dL (ref 0.44–1.00)
GFR calc non Af Amer: >60 mL/min (ref >60)
Glucose, Bld: 91 mg/dL (ref 70–99)
Potassium: 3.7 mmol/L (ref 3.5–5.1)
Sodium: 138 mmol/L (ref 135–145)
Total Bilirubin: 1.1 mg/dL (ref 0.3–1.2)
Total Protein: 6.1 g/dL — ABNORMAL LOW (ref 6.5–8.1)

## 2018-10-14 LAB — CBC
HCT: 31.1 % — ABNORMAL LOW (ref 36.0–46.0)
Hemoglobin: 10.2 g/dL — ABNORMAL LOW (ref 12.0–15.0)
MCH: 27.9 pg (ref 26.0–34.0)
MCHC: 32.8 g/dL (ref 30.0–36.0)
MCV: 85.2 fL (ref 80.0–100.0)
Platelets: 180 10*3/uL (ref 150–400)
RBC: 3.65 MIL/uL — ABNORMAL LOW (ref 3.87–5.11)
RDW: 14.3 % (ref 11.5–15.5)
WBC: 10.9 10*3/uL — ABNORMAL HIGH (ref 4.0–10.5)
nRBC: 0 % (ref 0.0–0.2)

## 2018-10-14 LAB — AMYLASE: Amylase: 270 U/L — ABNORMAL HIGH (ref 28–100)

## 2018-10-14 LAB — LIPASE, BLOOD: Lipase: 57 U/L — ABNORMAL HIGH (ref 11–51)

## 2018-10-14 NOTE — Progress Notes (Signed)
Subjective:  CC: Jill Abbott is a 78 y.o. female  Hospital stay day 4,   acute pancreatitis  HPI: Hx obtained through video interpreter.  Still feels some pain with food, but overall slowly improving.  Thinks it is the [place] food she is eating that maybe causing pain.  Having small BMs and urinating without any issue.  No nausea  ROS:  General: Denies weight loss, weight gain, fatigue, fevers, chills, and night sweats. Heart: Denies chest pain, palpitations, racing heart, irregular heartbeat, leg pain or swelling, and decreased activity tolerance. Respiratory: Denies breathing difficulty, shortness of breath, wheezing, cough, and sputum. GI: Denies change in appetite, heartburn, nausea, vomiting, constipation, diarrhea, and blood in stool. GU: Denies difficulty urinating, pain with urinating, urgency, frequency, blood in urine.   Objective:   Temp:  [98.1 F (36.7 C)-98.6 F (37 C)] 98.4 F (36.9 C) (03/31 0526) Pulse Rate:  [79-91] 79 (03/31 0526) Resp:  [18] 18 (03/31 0526) BP: (121-130)/(58-62) 121/58 (03/31 0526) SpO2:  [95 %-97 %] 95 % (03/31 0526)     Height: 5' (152.4 cm) Weight: 48.5 kg BMI (Calculated): 20.9   Intake/Output this shift:   Intake/Output Summary (Last 24 hours) at 10/14/2018 1056 Last data filed at 10/14/2018 0300 Gross per 24 hour  Intake 120 ml  Output 200 ml  Net -80 ml    Constitutional :  alert, cooperative, appears stated age and no distress  Respiratory:  clear to auscultation bilaterally  Cardiovascular:  regular rate and rhythm  Gastrointestinal: soft, minimally tender to palpation, mostly in epigastric LUQ.   Skin: Cool and moist.   Psychiatric: Normal affect, non-agitated, not confused       LABS:  CMP Latest Ref Rng & Units 10/14/2018 10/13/2018 10/12/2018  Glucose 70 - 99 mg/dL 91 17(R) 11(A)  BUN 8 - 23 mg/dL 9 17 57(X)  Creatinine 0.44 - 1.00 mg/dL 0.38 3.33 8.32(N)  Sodium 135 - 145 mmol/L 138 141 141  Potassium 3.5 -  5.1 mmol/L 3.7 3.8 4.7  Chloride 98 - 111 mmol/L 111 114(H) 115(H)  CO2 22 - 32 mmol/L 19(L) 16(L) 18(L)  Calcium 8.9 - 10.3 mg/dL 7.9(L) 7.2(L) 6.5(L)  Total Protein 6.5 - 8.1 g/dL 6.1(L) 5.7(L) 5.8(L)  Total Bilirubin 0.3 - 1.2 mg/dL 1.1 1.9(T) 1.3(H)  Alkaline Phos 38 - 126 U/L 72 55 52  AST 15 - 41 U/L 29 32 42(H)  ALT 0 - 44 U/L 32 37 51(H)   CBC Latest Ref Rng & Units 10/14/2018 10/13/2018 10/12/2018  WBC 4.0 - 10.5 K/uL 10.9(H) 14.2(H) 14.6(H)  Hemoglobin 12.0 - 15.0 g/dL 10.2(L) 10.4(L) 10.4(L)  Hematocrit 36.0 - 46.0 % 31.1(L) 33.2(L) 33.1(L)  Platelets 150 - 400 K/uL 180 170 156    RADS: n/a Assessment:   Acute pancreatitis.  Slowly improving.  Recommend advancing to regular diet now since numbers continue to improve and physical exam has minimal tenderness.  Ok to have food brought from home if allowed per current policies.  This will allow [place] to determine if continued minor pain exacerbation is due more to unfamiliar diet vs continued pancreatitis irritation.    If tolerating regular diet with no pain, ok to d/c from surgery standpoint with outpt f/u

## 2018-10-14 NOTE — Progress Notes (Signed)
[place] [facility] [place] NAME: Jill Abbott    MR#:  [id number]  DATE OF BIRTH:  July 29, 1940  SUBJECTIVE:  CHIEF COMPLAINT: interviewed with Language interpreter ipad at bedside, doing well, agreeable with advancing diet REVIEW OF SYSTEMS:  CONSTITUTIONAL: No fever, fatigue or weakness.  Endorsing dry mouth EYES: No blurred or double vision.  EARS, NOSE, AND THROAT: No tinnitus or ear pain.  RESPIRATORY: No cough, shortness of breath, wheezing or hemoptysis.  CARDIOVASCULAR: No chest pain, orthopnea, edema.  GASTROINTESTINAL: No nausea, vomiting, diarrhea.  Denies any abdominal pain GENITOURINARY: No dysuria, hematuria.  ENDOCRINE: No polyuria, nocturia,  HEMATOLOGY: No anemia, easy bruising or bleeding SKIN: No rash or lesion. MUSCULOSKELETAL: No joint pain or arthritis.   NEUROLOGIC: No tingling, numbness, weakness.  PSYCHIATRY: No anxiety or depression.  DRUG ALLERGIES:  No Known Allergies  VITALS:  Blood pressure (!) 116/57, pulse 89, temperature 98.8 F (37.1 C), temperature source Oral, resp. rate 18, height 5' (1.524 m), weight 48.5 kg, SpO2 95 %.  PHYSICAL EXAMINATION:  GENERAL:  78 y.o.-year-old patient lying in the bed with no acute distress.  EYES: Pupils equal, round, reactive to light and accommodation. No scleral icterus. Extraocular muscles intact.  HEENT: Head atraumatic, normocephalic. Oropharynx and nasopharynx clear.  NECK:  Supple, no jugular venous distention. No thyroid enlargement, no tenderness.  LUNGS: Normal breath sounds bilaterally, no wheezing, rales,rhonchi or crepitation. No use of accessory muscles of respiration.  CARDIOVASCULAR: S1, S2 normal. No murmurs, rubs, or gallops.  ABDOMEN: Soft, nontender, some distention is present Bowel sounds present. EXTREMITIES: No pedal edema, cyanosis, or clubbing.  NEUROLOGIC: Awake, alert and oriented x3 sensation intact. Gait not checked.  PSYCHIATRIC:  The patient is alert and oriented x 3.  SKIN: No obvious rash, lesion, or ulcer.  LABORATORY PANEL:   CBC Recent Labs  Lab 10/14/18 0300  WBC 10.9*  HGB 10.2*  HCT 31.1*  PLT 180   ------------------------------------------------------------------------------------------------------------------  Chemistries  Recent Labs  Lab 10/10/18 0842  10/14/18 0300  NA 138   < > 138  K 3.6   < > 3.7  CL 103   < > 111  CO2 26   < > 19*  GLUCOSE 162*   < > 91  BUN 25*   < > 9  CREATININE 0.96   < > 0.65  CALCIUM 7.4*   < > 7.9*  MG 2.1  --   --   AST 158*   < > 29  ALT 141*   < > 32  ALKPHOS 82   < > 72  BILITOT 0.6   < > 1.1   < > = values in this interval not displayed.   ------------------------------------------------------------------------------------------------------------------  Cardiac Enzymes No results for input(s): TROPONINI in the last 168 hours. ------------------------------------------------------------------------------------------------------------------  RADIOLOGY:  Dg Abd 1 View  Result Date: 10/13/2018 CLINICAL DATA:  Abdominal pain. EXAM: ABDOMEN - 1 VIEW COMPARISON:  CT scan of October 10, 2018. FINDINGS: The bowel gas pattern is normal. No radio-opaque calculi or other significant radiographic abnormality are seen. IMPRESSION: No evidence of bowel obstruction or ileus. Electronically Signed   By: Marijo Conception, M.D.   On: 10/13/2018 08:39   EKG:   Orders placed or performed during the hospital encounter of 10/10/18  . ED EKG 12-Lead  . ED EKG 12-Lead  . EKG 12-Lead  . EKG 12-Lead    ASSESSMENT AND PLAN:   #1.  Acute pancreatitis.  -  Stop IV fluids and antibiotic at this time. Clinically feeling better  Lipase [id number] Triglycerides 35  Pain management as needed Tolerating Clear liquids - advanced to full liquids Surgery is following and not considering any surgery at this time recommending outpatient surgery, no need of HIDA  scan  2.  Acute cholecystitis suspected.  -Total bilirubin is elevated, which is probably from inflammation from the pancreas and the common bile duct, repeat CMP in a.m. -No evidence of cholelithiasis on abdominal ultrasound and patient's bilirubin is normal   Pain management will be provided.   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  Hypertension.  continue amlodipine.  4.  Elevated LFTs.   Trending down Will hold off Lipitor.  Will follow LFTs with hydration.  5.  Dyslipidemia.  Lipitor is being held off.  6.  Asthma.    No exacerbation at this time we will continue Spiriva and as needed albuterol.  7.  Stage 3 rectocele. Possible posterior wall inclusion cyst as well. Suspected urinary stress incontinence.  appreciate OB input. pessary use vs. Surgery discussed by OB. Pessary would be preferable because of her age, comorbidities, and current surgical restrictions because of COVID-19 restrictions. She declines pessary at this time as the prolapse in not bothering her or effecting her bowel function. outpatient f/up with OB & surgery as needed for prolapse and stress incontinence.    DVT prophylaxis.  subcutaneous heparin     All the records are reviewed and case discussed with Care Management/Social Worker. Management plans discussed with the patient, (family over phone) she is  in agreement.  CODE STATUS:   TOTAL TIME TAKING CARE OF THIS PATIENT:  36  minutes.   POSSIBLE D/C IN 1 DAYS, DEPENDING ON CLINICAL CONDITION.  Note: This dictation was prepared with Dragon dictation along with smaller phrase technology. Any transcriptional errors that result from this process are unintentional.   Max Sane M.D on 10/14/2018 at 4:45 PM  Between 7am to 6pm - Pager - [phone] After 6pm go to [email or web address] - password [email or web address] [facility]  [facility]  Office  [phone]  CC: Primary  care physician; Albertina Parr, MD

## 2018-10-14 NOTE — Consult Note (Signed)
Reason for Consult:Vaginal Mass Referring Physician: [place], Jill Abbott is an 78 y.o. female.  HPI: Patient reported to nursing that when she goes to the bathroom to urinate that she can feel a mass in her vagina. She reports that this has been there for several years. She  Denies feeling of a vaginal bulge or pressure. She says that it is nonpainful. She reports that she urinates 5-6 times a day. Her nurse adds that her urination are generally small in volume. She notes incontinence of urine with cough, laugh, sneeze. She denies problems with constipation. She has has normal bowel movements 2-3 times a day. She denies any vaginal bleeding since menopause. She denies abnormal vaginal discharge. Denies rectal bleeding.   GYN History She went through menopause in her 25s. She entered menarche at 33. She reports that she always had regular monthly menses. She has retained her uterus, cervix and ovaries. She is no longer sexually active.  OB History Reports that she had 9 full term vaginal deliveries.    Past Medical History:  Diagnosis Date  . Asthma   . HLD (hyperlipidemia)   . Hypertension     Past Surgical History:  Procedure Laterality Date  . HERNIA REPAIR      No family history on file.  Social History:  reports that she quit smoking about 4 years ago. She has never used smokeless tobacco. She reports that she does not drink alcohol. No history on file for drug.  Allergies: No Known Allergies  Medications: I have reviewed the patient's current medications.  Results for orders placed or performed during the hospital encounter of 10/10/18 (from the past 48 hour(s))  Lipase, blood     Status: Abnormal   Collection Time: 10/13/18  4:31 AM  Result Value Ref Range   Lipase 54 (H) 11 - 51 U/L    Comment: Performed at [facility], [address] [place], [place] [address]  CBC with Differential/Platelet     Status: Abnormal   Collection Time: 10/13/18   4:31 AM  Result Value Ref Range   WBC 14.2 (H) 4.0 - 10.5 K/uL   RBC 3.70 (L) 3.87 - 5.11 MIL/uL   Hemoglobin 10.4 (L) 12.0 - 15.0 g/dL   HCT 18.3 (L) 43.7 - 35.7 %   MCV 89.7 80.0 - 100.0 fL   MCH 28.1 26.0 - 34.0 pg   MCHC 31.3 30.0 - 36.0 g/dL   RDW 89.7 84.7 - 84.1 %   Platelets 170 150 - 400 K/uL   nRBC 0.0 0.0 - 0.2 %   Neutrophils Relative % 86 %   Neutro Abs 12.2 (H) 1.7 - 7.7 K/uL   Lymphocytes Relative 5 %   Lymphs Abs 0.7 0.7 - 4.0 K/uL   Monocytes Relative 6 %   Monocytes Absolute 0.8 0.1 - 1.0 K/uL   Eosinophils Relative 2 %   Eosinophils Absolute 0.3 0.0 - 0.5 K/uL   Basophils Relative 0 %   Basophils Absolute 0.0 0.0 - 0.1 K/uL   Immature Granulocytes 1 %   Abs Immature Granulocytes 0.14 (H) 0.00 - 0.07 K/uL    Comment: Performed at [facility], [address] [place], [place] [address]  Comprehensive metabolic panel     Status: Abnormal   Collection Time: 10/13/18  4:31 AM  Result Value Ref Range   Sodium 141 135 - 145 mmol/L   Potassium 3.8 3.5 - 5.1 mmol/L   Chloride 114 (H) 98 - 111  mmol/L   CO2 16 (L) 22 - 32 mmol/L   Glucose, Bld 69 (L) 70 - 99 mg/dL   BUN 17 8 - 23 mg/dL   Creatinine, Ser 8.89 0.44 - 1.00 mg/dL   Calcium 7.2 (L) 8.9 - 10.3 mg/dL   Total Protein 5.7 (L) 6.5 - 8.1 g/dL   Albumin 2.7 (L) 3.5 - 5.0 g/dL   AST 32 15 - 41 U/L   ALT 37 0 - 44 U/L   Alkaline Phosphatase 55 38 - 126 U/L   Total Bilirubin 1.4 (H) 0.3 - 1.2 mg/dL   GFR calc non Af Amer >60 >60 mL/min   GFR calc Af Amer >60 >60 mL/min   Anion gap 11 5 - 15    Comment: Performed at [facility], [address] [place], [place] [address]  Bilirubin, direct     Status: Abnormal   Collection Time: 10/13/18  4:31 AM  Result Value Ref Range   Bilirubin, Direct 0.4 (H) 0.0 - 0.2 mg/dL    Comment: Performed at [facility], [address] [place], [place] [address]  Lipase, blood     Status: Abnormal   Collection Time: 10/14/18  3:00 AM  Result  Value Ref Range   Lipase 57 (H) 11 - 51 U/L    Comment: Performed at [facility], [address] [place], [place] [address]  CBC     Status: Abnormal   Collection Time: 10/14/18  3:00 AM  Result Value Ref Range   WBC 10.9 (H) 4.0 - 10.5 K/uL   RBC 3.65 (L) 3.87 - 5.11 MIL/uL   Hemoglobin 10.2 (L) 12.0 - 15.0 g/dL   HCT 49.1 (L) 79.1 - 50.5 %   MCV 85.2 80.0 - 100.0 fL   MCH 27.9 26.0 - 34.0 pg   MCHC 32.8 30.0 - 36.0 g/dL   RDW 69.7 94.8 - 01.6 %   Platelets 180 150 - 400 K/uL   nRBC 0.0 0.0 - 0.2 %    Comment: Performed at [facility], [address] [place], [place] [address]  Comprehensive metabolic panel     Status: Abnormal   Collection Time: 10/14/18  3:00 AM  Result Value Ref Range   Sodium 138 135 - 145 mmol/L   Potassium 3.7 3.5 - 5.1 mmol/L   Chloride 111 98 - 111 mmol/L   CO2 19 (L) 22 - 32 mmol/L   Glucose, Bld 91 70 - 99 mg/dL   BUN 9 8 - 23 mg/dL   Creatinine, Ser 8.27 0.44 - 1.00 mg/dL   Calcium 7.9 (L) 8.9 - 10.3 mg/dL   Total Protein 6.1 (L) 6.5 - 8.1 g/dL   Albumin 2.6 (L) 3.5 - 5.0 g/dL   AST 29 15 - 41 U/L   ALT 32 0 - 44 U/L   Alkaline Phosphatase 72 38 - 126 U/L   Total Bilirubin 1.1 0.3 - 1.2 mg/dL   GFR calc non Af Amer >60 >60 mL/min   GFR calc Af Amer >60 >60 mL/min   Anion gap 8 5 - 15    Comment: Performed at [facility], [address] [place], [place] [address]  Amylase     Status: Abnormal   Collection Time: 10/14/18  3:00 AM  Result Value Ref Range   Amylase 270 (H) 28 - 100 U/L    Comment: Performed at [facility], [address] [place], [place] [address]    Dg Abd 1 View  Result Date: 10/13/2018 CLINICAL DATA:  Abdominal pain. EXAM: ABDOMEN - 1 VIEW COMPARISON:  CT scan of October 10, 2018. FINDINGS: The bowel gas pattern is normal. No radio-opaque calculi or other significant radiographic abnormality are seen. IMPRESSION: No evidence of bowel obstruction or ileus. Electronically Signed    By: Lupita RaiderJames  Green Jr, M.D.   On: 10/13/2018 08:39    Review of Systems  Constitutional: Negative for chills, fever, malaise/fatigue and weight loss.  HENT: Negative for congestion, hearing loss and sinus pain.   Eyes: Negative for blurred vision and double vision.  Respiratory: Negative for cough, sputum production, shortness of breath and wheezing.   Cardiovascular: Negative for chest pain, palpitations, orthopnea and leg swelling.  Gastrointestinal: Negative for abdominal pain, constipation, diarrhea, nausea and vomiting.  Genitourinary: Negative for dysuria, flank pain, frequency, hematuria and urgency.  Musculoskeletal: Negative for back pain, falls and joint pain.  Skin: Negative for itching and rash.  Neurological: Negative for dizziness and headaches.  Psychiatric/Behavioral: Negative for depression, substance abuse and suicidal ideas. The patient is not nervous/anxious.    Blood pressure (!) 116/57, pulse 89, temperature 98.8 F (37.1 C), temperature source Oral, resp. rate 18, height 5' (1.524 m), weight 48.5 kg, SpO2 95 %. Physical Exam  Nursing note and vitals reviewed. Constitutional: She is oriented to person, place, and time. She appears well-developed and well-nourished.  HENT:  Head: Normocephalic and atraumatic.  Cardiovascular: Normal rate and regular rhythm.  Respiratory: Effort normal and breath sounds normal.  GI: She exhibits distension. She exhibits no mass. There is no abdominal tenderness. There is no rebound and no guarding.  + mild/moderate ascities  Genitourinary:    Genitourinary Comments: Stage 3 rectocele, possible posterior wall inclusion cysts.   Normal bimanual exam. No cervical masses. No blood on examination. No adnexal masses appreciated, however limited exam from mild to moderate abdominal ascites.   Rectal/posterior wall prolapse. No uterine or bladder prolapse.  Normal Rectal exam. Normal tone. No rectal masses appreciated.    Neurological: She  is alert and oriented to person, place, and time.  Skin: Skin is warm and dry.  Psychiatric: She has a normal mood and affect. Her behavior is normal. Judgment and thought content normal.    Assessment/Plan: 78 yo with a vaginal bulge 1. Appears to have stage 3 rectocele. Possible posterior wall inclusion cyst as well. Suspected urinary stress incontinence.  Discussed with the patient options for pessary use vs. surgery. Pessary would be preferable because of her age, comorbidities, and current surgical restrictions because of COVID-19 restrictions. She declines pessary at this time as the prolapse in not bothering her or effecting her bowel function. Can follow up with the patient outpatient as needed for prolapse and stress incontinence.   Plan discussed with patient and consulting physician.   Jill Abbott R Jill Abbott 10/14/2018, 12:34 PM

## 2018-10-15 LAB — CBC
HCT: 28.9 % — ABNORMAL LOW (ref 36.0–46.0)
Hemoglobin: 9.5 g/dL — ABNORMAL LOW (ref 12.0–15.0)
MCH: 27.6 pg (ref 26.0–34.0)
MCHC: 32.9 g/dL (ref 30.0–36.0)
MCV: 84 fL (ref 80.0–100.0)
Platelets: 183 10*3/uL (ref 150–400)
RBC: 3.44 MIL/uL — ABNORMAL LOW (ref 3.87–5.11)
RDW: 14.1 % (ref 11.5–15.5)
WBC: 8.4 10*3/uL (ref 4.0–10.5)
nRBC: 0 % (ref 0.0–0.2)

## 2018-10-15 LAB — CULTURE, BLOOD (ROUTINE X 2)
CULTURE: NO GROWTH
Culture: NO GROWTH
Special Requests: ADEQUATE
Special Requests: ADEQUATE

## 2018-10-15 LAB — BASIC METABOLIC PANEL
Anion gap: 6 (ref 5–15)
BUN: 7 mg/dL — ABNORMAL LOW (ref 8–23)
CALCIUM: 7.8 mg/dL — AB (ref 8.9–10.3)
CO2: 22 mmol/L (ref 22–32)
Chloride: 110 mmol/L (ref 98–111)
Creatinine, Ser: 0.58 mg/dL (ref 0.44–1.00)
GFR calc Af Amer: >60 mL/min (ref >60)
GFR calc non Af Amer: >60 mL/min (ref >60)
Glucose, Bld: 90 mg/dL (ref 70–99)
Potassium: 3.9 mmol/L (ref 3.5–5.1)
Sodium: 138 mmol/L (ref 135–145)

## 2018-10-15 LAB — LIPASE, BLOOD: Lipase: 112 U/L — ABNORMAL HIGH (ref 11–51)

## 2018-10-15 MED ORDER — MAGNESIUM HYDROXIDE 400 MG/5ML PO SUSP: 30.0000 mL | Freq: Every day | ORAL | 0 refills | Status: AC | PRN

## 2018-10-15 NOTE — Progress Notes (Signed)
Subjective:  CC: Jill Abbott is a 78 y.o. female  Hospital stay day 5,   acute pancreatitis  HPI: Hx obtained through RN.  Pain continues to improve, some bloating at dinner time but not necessary any pain.  Having small BMs and urinating without any issue.  No nausea  ROS:  General: Denies weight loss, weight gain, fatigue, fevers, chills, and night sweats. Heart: Denies chest pain, palpitations, racing heart, irregular heartbeat, leg pain or swelling, and decreased activity tolerance. Respiratory: Denies breathing difficulty, shortness of breath, wheezing, cough, and sputum. GI: Denies change in appetite, heartburn, nausea, vomiting, constipation, diarrhea, and blood in stool. GU: Denies difficulty urinating, pain with urinating, urgency, frequency, blood in urine.   Objective:   Temp:  [97.6 F (36.4 C)-98.8 F (37.1 C)] 98.1 F (36.7 C) (04/01 0549) Pulse Rate:  [86-89] 86 (04/01 0549) Resp:  [18] 18 (04/01 0549) BP: (116-136)/(57-69) 124/59 (04/01 0549) SpO2:  [95 %-99 %] 99 % (04/01 0549)     Height: 5' (152.4 cm) Weight: 48.5 kg BMI (Calculated): 20.9   Intake/Output this shift:  No intake or output data in the 24 hours ending 10/15/18 0757  Constitutional :  alert, cooperative, appears stated age and no distress  Respiratory:  clear to auscultation bilaterally  Cardiovascular:  regular rate and rhythm  Gastrointestinal: soft, minimally tender to deep palpation, only in epigastric region now.  Improving overall.   Skin: Cool and moist.   Psychiatric: Normal affect, non-agitated, not confused       LABS:  CMP Latest Ref Rng & Units 10/15/2018 10/14/2018 10/13/2018  Glucose 70 - 99 mg/dL 90 91 33(L)  BUN 8 - 23 mg/dL 7(L) 9 17  Creatinine 4.56 - 1.00 mg/dL 2.56 3.89 3.73  Sodium 135 - 145 mmol/L 138 138 141  Potassium 3.5 - 5.1 mmol/L 3.9 3.7 3.8  Chloride 98 - 111 mmol/L 110 111 114(H)  CO2 22 - 32 mmol/L 22 19(L) 16(L)  Calcium 8.9 - 10.3 mg/dL 7.8(L)  7.9(L) 7.2(L)  Total Protein 6.5 - 8.1 g/dL - 6.1(L) 5.7(L)  Total Bilirubin 0.3 - 1.2 mg/dL - 1.1 4.2(A)  Alkaline Phos 38 - 126 U/L - 72 55  AST 15 - 41 U/L - 29 32  ALT 0 - 44 U/L - 32 37   CBC Latest Ref Rng & Units 10/15/2018 10/14/2018 10/13/2018  WBC 4.0 - 10.5 K/uL 8.4 10.9(H) 14.2(H)  Hemoglobin 12.0 - 15.0 g/dL 7.6(O) 10.2(L) 10.4(L)  Hematocrit 36.0 - 46.0 % 28.9(L) 31.1(L) 33.2(L)  Platelets 150 - 400 K/uL 183 180 170    RADS: n/a Assessment:   Acute pancreatitis. Improved.  Will see how she does with breakfast and lunch and still with no or minimal pain, ok to d/c from surgery standpoint.  F/u for possible discussion of GB removal as an outpt basis.  Rectocele repair is not within my scope of practice. So will need f/u with OBGYN.

## 2018-10-15 NOTE — Discharge Instructions (Signed)
Pancreatitis aguda Acute Pancreatitis  La pancreatitis aguda es una afeccin en la cual el pncreas de repente se irrita y se hincha (tiene inflamacin). El pncreas es una glndula grande ubicada detrs del [place]. Produce enzimas que ayudan a Retail buyer. Tambin produce hormonas que ayudan a Geophysical data processor de Banker. La pancreatitis aguda ocurre cuando las enzimas atacan el pncreas y lo daan. La mayor parte de los ataques duran un par de [address]  [phone] y pueden ocasionar problemas graves. Siga estas indicaciones en su casa: Comida y bebida   Siga las indicaciones del mdico respecto de la dieta. Puede ser necesario hacer lo siguiente: ? Evite el alcohol. ? Limite la cantidad de grasa en la dieta.  Consuma pequeas cantidades de comida con ms frecuencia. No consuma grandes cantidades de comida.  Beba suficiente lquido para mantener el pis (orina) claro o de color amarillo plido.  No consuma alcohol si esta fue la causa de su enfermedad. Instrucciones generales  [employer] de venta libre y los recetados solamente como se lo haya indicado el mdico.  No use productos que contengan tabaco. Estos incluyen cigarrillos, tabaco para Theatre manager y Administrator, Civil Service. Si necesita ayuda para dejar de fumar, consulte al mdico.  Descanse lo suficiente.  Si se lo indican, contrlese en su casa el nivel de azcar en la sangre como se lo haya indicado el mdico.  Concurra a todas las visitas de seguimiento como se lo haya indicado el mdico. Esto es importante. Comunquese con un mdico si:  No mejora en el tiempo en que se esperaba.  Aparecen nuevos sntomas.  Los sntomas empeoran.  Tiene dolor o debilidad persistentes.  Sigue teniendo Programme researcher, broadcasting/film/video (nuseas).  Comienza a mejorar y sufre un nuevo ataque de Engineer, mining.  Tiene fiebre. Solicite ayuda de inmediato si:  No puede comer ni retener lquidos.  El dolor se vuelve muy intenso.  Nota que la  piel o la zona blanca del ojo estn amarillas (ictericia).  Devuelve (vomita).  Se siente mareado o se desvanece (se desmaya).  Su nivel de azcar en la sangre es alto (ms de 300mg /dl). Esta informacin no tiene Theme park manager el consejo del mdico. Asegrese de hacerle al mdico cualquier pregunta que tenga. Document Released: 01/01/2012 Document Revised: 12/25/2016 Document Reviewed: 04/05/2015 Elsevier Interactive Patient Education  2019 [employer].

## 2018-10-15 NOTE — Progress Notes (Signed)
New referral for outpatient Palliative to follow post discharge received from [facility] Plan is for discharge home today, patient will be receiving home health. Patient information faxed to referral. Thank you. Dayna Barker BSN, RN, [facility] [place] [phone]

## 2018-10-17 NOTE — Discharge Summary (Signed)
[facility] [place] at [facility]   PATIENT NAME: Jill CostaGraciela Duarte Abbott    MR#:  [id number]  DATE OF BIRTH:  1940-10-16  DATE OF ADMISSION:  10/10/2018   ADMITTING PHYSICIAN: Houston SirenVivek J Sainani, MD  DATE OF DISCHARGE: 10/15/2018  1:36 PM  PRIMARY CARE PHYSICIAN: Vevelyn RoyalsBiola, Holly, MD   ADMISSION DIAGNOSIS:  RUQ pain [R10.11] DISCHARGE DIAGNOSIS:  Active Problems:   Acute pancreatitis  SECONDARY DIAGNOSIS:   Past Medical History:  Diagnosis Date   Asthma    HLD (hyperlipidemia)    Hypertension    HOSPITAL COURSE:  78 y.o. female with a known history of hypertension, asthma and dyslipidemia admitted for acute pancreatitis  #1. Acute pancreatitis - improved Clinically feeling better  Lipase [id number] -[id number] Triglycerides 35  Pain under control & Tolerating diet  2. Acute cholecystitis suspected.  -Total bilirubin is elevated, which is probably from inflammation from the pancreas and the common bile duct -No evidence of cholelithiasis on abdominal ultrasound and patient's bilirubin is normal - outpt surgery f/up   3. Hypertension. continue amlodipine.  4. Elevated LFTs.  Trending down  5. Dyslipidemia.Lipitor can be resumed at D/C  6. Asthma.  No exacerbation at this time we will continue Spiriva and as needed albuterol.  7. Stage 3 rectocele. Possible posterior wall inclusion cyst as well. Suspected urinary stress incontinence. Pessary use vs. Surgery discussed by OB. Pessary would be preferable because of her age, comorbidities, and current surgical restrictions because of COVID-19 restrictions. She declines pessary at this time as the prolapse in not bothering her or effecting her bowel function. outpatient f/up with OB & surgery as needed for prolapse and stress incontinence.   DISCHARGE CONDITIONS:  stable CONSULTS OBTAINED:  Treatment Team:  Sung AmabileSakai, Isami, DO DRUG ALLERGIES:  No Known Allergies DISCHARGE MEDICATIONS:     Allergies as of 10/15/2018   No Known Allergies     Medication List    STOP taking these medications   benzonatate 200 MG capsule Commonly known as:  TESSALON     TAKE these medications   amLODipine 5 MG tablet Commonly known as:  NORVASC Take 5 mg by mouth daily.   aspirin 81 MG EC tablet Take 81 mg by mouth daily.   atorvastatin 20 MG tablet Commonly known as:  LIPITOR Take 20 mg by mouth daily.   Calci-Chew 1250 (500 Ca) MG chewable tablet Generic drug:  calcium carbonate Chew 1 tablet by mouth 2 (two) times daily.   CVS D3 25 MCG (1000 UT) capsule Generic drug:  Cholecalciferol Take 1,000 Units by mouth 2 (two) times daily.   diclofenac sodium 1 % Gel Commonly known as:  VOLTAREN Apply 2 g topically 4 (four) times daily.   guaiFENesin-dextromethorphan 100-10 MG/5ML syrup Commonly known as:  ROBITUSSIN DM Take 5 mLs by mouth every 4 (four) hours as needed for cough.   magnesium hydroxide 400 MG/5ML suspension Commonly known as:  MILK OF MAGNESIA Take 30 mLs by mouth daily as needed for mild constipation.   Spiriva HandiHaler 18 MCG inhalation capsule Generic drug:  tiotropium Place 18 mcg into inhaler and inhale daily.   triamterene-hydrochlorothiazide 37.5-25 MG tablet Commonly known as:  MAXZIDE-25 Take 1 tablet by mouth daily.   Trolamine Salicylate 10 % Lotn Apply to painful area twice a day        DISCHARGE INSTRUCTIONS:   DIET:  Low fat, Low cholesterol diet DISCHARGE CONDITION:  Good ACTIVITY:  Activity as tolerated OXYGEN:  Home Oxygen:  No.  Oxygen Delivery: room air DISCHARGE LOCATION:  home   If you experience worsening of your admission symptoms, develop shortness of breath, life threatening emergency, suicidal or homicidal thoughts you must seek medical attention immediately by calling 911 or calling your MD immediately  if symptoms less severe.  You Must read complete instructions/literature along with all the possible  adverse reactions/side effects for all the Medicines you take and that have been prescribed to you. Take any new Medicines after you have completely understood and accpet all the possible adverse reactions/side effects.   Please note  You were cared for by a hospitalist during your hospital stay. If you have any questions about your discharge medications or the care you received while you were in the hospital after you are discharged, you can call the unit and asked to speak with the hospitalist on call if the hospitalist that took care of you is not available. Once you are discharged, your primary care physician will handle any further medical issues. Please note that NO REFILLS for any discharge medications will be authorized once you are discharged, as it is imperative that you return to your primary care physician (or establish a relationship with a primary care physician if you do not have one) for your aftercare needs so that they can reassess your need for medications and monitor your lab values.    On the day of Discharge:  VITAL SIGNS:  Blood pressure (!) 124/59, pulse 86, temperature 98.1 F (36.7 C), temperature source Oral, resp. rate 18, height 5' (1.524 m), weight 48.5 kg, SpO2 99 %. PHYSICAL EXAMINATION:  GENERAL:  78 y.o.-year-old patient lying in the bed with no acute distress.  EYES: Pupils equal, round, reactive to light and accommodation. No scleral icterus. Extraocular muscles intact.  HEENT: Head atraumatic, normocephalic. Oropharynx and nasopharynx clear.  NECK:  Supple, no jugular venous distention. No thyroid enlargement, no tenderness.  LUNGS: Normal breath sounds bilaterally, no wheezing, rales,rhonchi or crepitation. No use of accessory muscles of respiration.  CARDIOVASCULAR: S1, S2 normal. No murmurs, rubs, or gallops.  ABDOMEN: Soft, non-tender, non-distended. Bowel sounds present. No organomegaly or mass.  EXTREMITIES: No pedal edema, cyanosis, or clubbing.   NEUROLOGIC: Cranial nerves II through XII are intact. Muscle strength 5/5 in all extremities. Sensation intact. Gait not checked.  PSYCHIATRIC: The patient is alert and oriented x 3.  SKIN: No obvious rash, lesion, or ulcer.  DATA REVIEW:   CBC Recent Labs  Lab 10/15/18 0435  WBC 8.4  HGB 9.5*  HCT 28.9*  PLT 183    Chemistries  Recent Labs  Lab 10/14/18 0300 10/15/18 0435  NA 138 138  K 3.7 3.9  CL 111 110  CO2 19* 22  GLUCOSE 91 90  BUN 9 7*  CREATININE 0.65 0.58  CALCIUM 7.9* 7.8*  AST 29  --   ALT 32  --   ALKPHOS 72  --   BILITOT 1.1  --      Follow-up Information    Sakai, Isami, DO. Go on 11/12/2018.   Specialty:  Surgery Why:  at 11:30am for discussion of possible gallbladder surgery Contact information: [address] [place] [address] [phone]        Vevelyn Royals, MD. Go on 10/20/2018.   Specialty:  Family Medicine Why:  at 1pm for hospital follow up Contact information: [address] [facility] [place] [address] [phone]        Natale Milch, MD. Nyra Capes  on 10/21/2018.   Specialty:  Obstetrics and Gynecology Why:  at 10:50am for follow up for vaginal bulge Contact information: [address] [place] [address] [phone]           Management plans discussed with the patient, family and they are in agreement.  CODE STATUS: Prior   TOTAL TIME TAKING CARE OF THIS PATIENT: 45 minutes.    Delfino Lovett M.D on 10/17/2018 at 8:58 AM  Between 7am to 6pm - Pager - [phone]  After 6pm go to [email or web address] - Social research officer, government  [facility] [place] [facility]  Office  [phone]  CC: Primary care physician; Vevelyn Royals, MD   Note: This dictation was prepared with Dragon dictation along with smaller phrase technology. Any transcriptional errors that result from this process are unintentional.

## 2018-10-21 ENCOUNTER — Encounter: Payer: Self-pay | Admitting: Obstetrics and Gynecology

## 2018-11-26 ENCOUNTER — Telehealth: Payer: Self-pay | Admitting: Nurse Practitioner

## 2018-11-26 NOTE — Telephone Encounter (Signed)
Called daughter to schedule Palliative consult, no answer. Have called on 11/06/18; 11/12/18 and 11/20/18 and left messages as well.  Left message today requesting call back by end of week and if no call back I would notify MD.

## 2018-12-03 ENCOUNTER — Telehealth: Payer: Self-pay | Admitting: Nurse Practitioner

## 2018-12-03 NOTE — Telephone Encounter (Signed)
Called Jill Abbott back with no answer.  Left message letting her know that we could schedule the consult around 4 and requested a call back so that we could schedule a date for this.

## 2018-12-03 NOTE — Telephone Encounter (Signed)
Talked with Byrd Hesselbach, patient's daughter about scheduling Palliative consult and I asked her if she felt comfortable answering the Nurse Practitioner's questions or would she rather we use an interpreter, she stated that she would like for her son to talk with the NP.  She stated that he would not be available until 4 or 4:30 to do this.  I explained to her that I would need to get the NP's okay to schedule this late in the day and that I would call her back to let her know.  She was in agreement with this.

## 2018-12-11 ENCOUNTER — Telehealth: Payer: Self-pay | Admitting: Nurse Practitioner

## 2018-12-11 NOTE — Telephone Encounter (Signed)
Talked with patient's grandson Sederico (?) who speaks [place] and he is available for the Palliative Consult tomorrow  12/12/18 @ 10:30 AM

## 2018-12-12 ENCOUNTER — Encounter: Payer: Self-pay | Admitting: Nurse Practitioner

## 2018-12-12 ENCOUNTER — Other Ambulatory Visit: Payer: Medicare Other | Admitting: Nurse Practitioner

## 2018-12-12 ENCOUNTER — Other Ambulatory Visit: Payer: Self-pay

## 2018-12-12 DIAGNOSIS — Z515 Encounter for palliative care: Secondary | ICD-10-CM

## 2018-12-12 NOTE — Progress Notes (Signed)
[facility] Consult Note Telephone: [phone]  Fax: [phone]  PATIENT NAME: Jill Abbott DOB: 02-03-1941 MRN: [id number]  PRIMARY CARE PROVIDER:   Vevelyn Abbott, Holly, MD  REFERRING PROVIDER:  Vevelyn Abbott, Holly, MD [address] [place], [place] [address]  RESPONSIBLE PARTY:   Self;   Due to the COVID-19 crisis, this visit was done via telemedicine from my office and it was initiated and consent by this patient and or family.  Asked by Jill Abbott to Palliative care consult for goc  RECOMMENDATIONS and PLAN:   1. Palliative care encounter Z51.5; Palliative medicine team will continue to support patient, patient's family, and medical team. Visit consisted of counseling and education dealing with the complex and emotionally intense issues of symptom management and palliative care in the setting of serious and potentially life-threatening illness  ASSESSMENT:     Telephone visit completed as video not available per family. I called and spoke with the grandson as a translator her daughter patient. We talked about purpose for palliative care visit and verbal agreement received. We talked about past medical history and chronic disease progression. We talked about symptoms of pain and shortness of breath but she does not exhibit. We talked about recent hospitalization. We talked about her cognitive functional abilities. We talked about her appetite which is improved. We talked about medical goals of care including an aggressive versus conservative vs Comfort Care. We talked about most form and her choice book. Family wishes to review most form and her choice book prior to completing and will mail that to them. We talked about role of palliative care and plan of care. We scheduled a follow-up appointment for 2 weeks for further discussion of goals of care. Therapeutic listening and emotional support provided. Contact information provided. Questions  answered to satisfaction.  3 / 29 / 2020 sodium 141, potassium 4.7, chloride 115, Co2 18, calcium 6.5, bun 27, Creatinine 1.06, glucose 65, albumin 2.9, total protein 5.8, WBC 14.6, hemoglobin 10.4, hematocrit 33.1, platelets 156  I spent 60 minutes providing this consultation,  from 10:30am to 11:30am. More than 50% of the time in this consultation was spent coordinating communication.   HISTORY OF PRESENT ILLNESS:  Jill Abbott is a 78 y.o. year old female with multiple medical problems including Hypertension, hyperlipidemia, history of acute pancreatitis, asthma,  MGUS, osteoporosis, hernia repair, history of sacral fracture 05/2015. [facility] 1/3 / 2020 for IgG kappa MGUS with recommendation to follow up in two to three months. [facility] on     2/3 / 2020 Hospitalized 3 / 27 / 2020 to 4 / 1 / 2020 for acute pancreatitis, acute cholecystitis suspected with total bilirubin elevated and likely from inflammation from pancreas and common bile duct. No evidence of cholelithiasis on ultrasound and bilirubin normal eyes. Hypertension does take amlodipine. Lipitor was resumed upon discharge as elevated lfts turned it down. Stage 3 rectocele the possible pushed here while inclusion cyst suspected urinary stress incontinence. Pessary use versus surgery with hesi-re preferred due to age comorbidities and recent current surgical restrictions because prolapse is not bothering her. She lives at home with her daughter and [name]. She does not speak [place] but her grandson does. Palliative Care was asked to help address goals of care.   CODE STATUS: Full  PPS: 40% HOSPICE ELIGIBILITY/DIAGNOSIS: TBD  PAST MEDICAL HISTORY:  Past Medical History:  Diagnosis Date   Asthma  HLD (hyperlipidemia)    Hypertension     SOCIAL HX:  Social History   Tobacco Use   Smoking status: Former Smoker    Last attempt to quit: 07/16/2014    Years since  quitting: 4.4   Smokeless tobacco: Never Used  Substance Use Topics   Alcohol use: No    ALLERGIES: No Known Allergies   PERTINENT MEDICATIONS:  Outpatient Encounter Medications as of 12/12/2018  Medication Sig   amLODipine (NORVASC) 5 MG tablet Take 5 mg by mouth daily.   aspirin 81 MG EC tablet Take 81 mg by mouth daily.   atorvastatin (LIPITOR) 20 MG tablet Take 20 mg by mouth daily.   CALCI-CHEW 1250 (500 Ca) MG chewable tablet Chew 1 tablet by mouth 2 (two) times daily.   CVS D3 1000 units capsule Take 1,000 Units by mouth 2 (two) times daily.   diclofenac sodium (VOLTAREN) 1 % GEL Apply 2 g topically 4 (four) times daily.   guaiFENesin-dextromethorphan (ROBITUSSIN DM) 100-10 MG/5ML syrup Take 5 mLs by mouth every 4 (four) hours as needed for cough. (Patient not taking: Reported on 10/10/2018)   magnesium hydroxide (MILK OF MAGNESIA) 400 MG/5ML suspension Take 30 mLs by mouth daily as needed for mild constipation.   SPIRIVA HANDIHALER 18 MCG inhalation capsule Place 18 mcg into inhaler and inhale daily.   triamterene-hydrochlorothiazide (MAXZIDE-25) 37.5-25 MG tablet Take 1 tablet by mouth daily.   Trolamine Salicylate 10 % LOTN Apply to painful area twice a day   No facility-administered encounter medications on file as of 12/12/2018.     PHYSICAL EXAM:  Deferred  Jill Convey Z Chalice Philbert, NP

## 2019-01-15 ENCOUNTER — Telehealth: Payer: Self-pay | Admitting: Nurse Practitioner

## 2019-01-15 NOTE — Telephone Encounter (Signed)
I called Verdis Frederickson, Ms. Orozco's daughter for scheduled PC visit. Verdis Frederickson endorses that her son is not available and her mother has moved in with her brother. [name] with limited English. She asked to be recontacted at 1pm today when her son is with her and can interpret. Agree to recontact at 1pm today

## 2019-01-16 ENCOUNTER — Telehealth: Payer: Self-pay | Admitting: Nurse Practitioner

## 2019-01-16 NOTE — Telephone Encounter (Signed)
Called to reschedule the 7/2 f/u visit due to the patient's Grandson was not available to interpret, no answer.  Left message asking the grandson the call me back to reschedule the visit.  Left my name and contact information.

## 2019-01-23 ENCOUNTER — Telehealth: Payer: Self-pay | Admitting: Nurse Practitioner

## 2019-01-23 NOTE — Telephone Encounter (Signed)
Spoke with patient's grandson Sederico, and have rescheduled a Telephone Palliative f/u visit for 01/29/19 @ 9 AM

## 2019-01-29 ENCOUNTER — Other Ambulatory Visit: Payer: Self-pay

## 2019-01-29 ENCOUNTER — Other Ambulatory Visit: Payer: Medicare Other | Admitting: Nurse Practitioner

## 2019-01-29 ENCOUNTER — Telehealth: Payer: Self-pay | Admitting: Nurse Practitioner

## 2019-01-29 NOTE — Telephone Encounter (Signed)
I called Verdis Frederickson, Ms. Gabriellia daughter for [facility] f/u scheduled visit. Verdis Frederickson endorses she will have to reschedule and with limited visit asked to call back when her son is available to translate

## 2019-02-18 ENCOUNTER — Telehealth: Payer: Self-pay | Admitting: Nurse Practitioner

## 2019-02-18 NOTE — Telephone Encounter (Signed)
Called patient's daughter Verdis Frederickson to schedule a Palliative f/u visit, no answer.  Left message with reason for call and requested she have her son Sederico to call me back so that we could schedule the f/u visit for whenever he is available to do so, since he will be translating.  Left my name and contact number.

## 2019-03-05 ENCOUNTER — Telehealth: Payer: Self-pay | Admitting: Nurse Practitioner

## 2019-03-05 NOTE — Telephone Encounter (Signed)
Spoke with patient's grandson Sedrico and daughter and have scheduled a Telephone Palliative f/u visit for 03/17/19 @ 9 AM.

## 2019-03-17 ENCOUNTER — Other Ambulatory Visit: Payer: Self-pay

## 2019-03-17 ENCOUNTER — Other Ambulatory Visit: Payer: Medicare Other | Admitting: Nurse Practitioner

## 2019-03-17 ENCOUNTER — Encounter: Payer: Self-pay | Admitting: Nurse Practitioner

## 2019-03-17 DIAGNOSIS — Z515 Encounter for palliative care: Secondary | ICD-10-CM

## 2019-03-17 NOTE — Progress Notes (Signed)
Therap[facility], [facility] Consult Note Telephone: [phone](336) [phone]  Fax: [phone]  PATIENT NAME: Jill Abbott DOB: [birth year] MRN: [id number]  PRIMARY CARE PROVIDER:   Vevelyn RoyalsBiola, Holly, MD  REFERRING PROVIDER:  Vevelyn RoyalsBiola, Holly, MD [address] [place],  [place] [address]  RESPONSIBLE PARTY:    Self;   Due to the COVID-19 crisis, this visit was done via telemedicine from my office and it was initiated and consent by this patient and or family.  RECOMMENDATIONS and PLAN:  1. ACP: Full code with aggressive interventions; Blank MOST form and hard choice book mailed and family wishes to follow-up with primary provider.   2. Palliative care encounter Z51.5; Palliative medicine team will continue to support patient, patient's family, and medical team. Visit consisted of counseling and education dealing with the complex and emotionally intense issues of symptom management and palliative care in the setting of serious and potentially life-threatening illness  I spent 30 minutes providing this consultation,  From 9:00am to 9:30am. More than 50% of the time in this consultation was spent coordinating communication.   HISTORY OF PRESENT ILLNESS:  Jill Abbott is a 78 y.o. year old female with multiple medical problems including Hypertension, hyperlipidemia, history of acute pancreatitis, asthma, MGUS, osteoporosis, hernia repair, history of sacral fracture 05/2015. [facility] 1/3 / 2020 for IgG kappa MGUS with recommendation to follow up in two to three months. [facility] on 2/3 / 2020 Hospitalized 3 / 27 / 2020 to 4 / 1 / 2020 for acute pancreatitis, acute cholecystitis suspected with total bilirubin elevated and likely from inflammation from pancreas and common bile duct. No evidence of cholelithiasis on ultrasound and bilirubin normal. Full code. Most form and her choice book was sent last PC visit. I  called for schedule palliative care visit, Jill HesselbachMaria her daughter answer the phone. Requested to speak with the grandson for translation and Jill HesselbachMaria an agreement. We talked about purpose for palliative care visit, Jill HesselbachMaria agreed. We talked about how Jill Abbott was feeling today. She shared that she's doing fine. She has no complaints or concerns. No recent hospitalizations, falls, wounds come infections. Her appetite is very good. She does dress herself in ambulate. We talked about medical goals of care and decided that they wanted to further discuss with their own primary care provider. We talked about role of palliative care and plan of care. Asked if they wanted to continue palliative care services and explain the benefit. Jill Abbott endorses it's best for them just to deal with one provider which is their primary care provider. Jill Abbott endorses that they will contact primary care for their needs. Jill Abbott endorses that if her mom declines they will recontact palliative care to restart Services. Therapeutic listening and emotional support provided. Questions answered the satisfaction. Contact information provided. She lives at home with her daughter and Jill Abbott. She does not speak [place] but her grandson does. Palliative Care was asked to help to continue to address goals of care.   CODE STATUS: Full code  PPS: 50% HOSPICE ELIGIBILITY/DIAGNOSIS: TBD  PAST MEDICAL HISTORY:  Past Medical History:  Diagnosis Date  . Asthma   . HLD (hyperlipidemia)   . Hypertension     SOCIAL HX:  Social History   Tobacco Use  . Smoking status: Former Smoker    Quit date: 07/16/2014    Years since quitting: 4.6  . Smokeless tobacco: Never Used  Substance Use Topics  . Alcohol  use: No    ALLERGIES: No Known Allergies   PERTINENT MEDICATIONS:  Outpatient Encounter Medications as of 03/17/2019  Medication Sig  . amLODipine (NORVASC) 5 MG tablet Take 5 mg by mouth daily.  Marland Kitchen aspirin 81 MG EC tablet Take 81 mg by mouth daily.  Marland Kitchen  atorvastatin (LIPITOR) 20 MG tablet Take 20 mg by mouth daily.  Marland Kitchen CALCI-CHEW 1250 (500 Ca) MG chewable tablet Chew 1 tablet by mouth 2 (two) times daily.  . CVS D3 1000 units capsule Take 1,000 Units by mouth 2 (two) times daily.  . diclofenac sodium (VOLTAREN) 1 % GEL Apply 2 g topically 4 (four) times daily.  Marland Kitchen guaiFENesin-dextromethorphan (ROBITUSSIN DM) 100-10 MG/5ML syrup Take 5 mLs by mouth every 4 (four) hours as needed for cough. (Patient not taking: Reported on 10/10/2018)  . magnesium hydroxide (MILK OF MAGNESIA) 400 MG/5ML suspension Take 30 mLs by mouth daily as needed for mild constipation.  Marland Kitchen SPIRIVA HANDIHALER 18 MCG inhalation capsule Place 18 mcg into inhaler and inhale daily.  Marland Kitchen triamterene-hydrochlorothiazide (MAXZIDE-25) 37.5-25 MG tablet Take 1 tablet by mouth daily.  Loura Pardon Salicylate 10 % LOTN Apply to painful area twice a day   No facility-administered encounter medications on file as of 03/17/2019.     PHYSICAL EXAM:   Deferred Jill Abbott Jill Caylie Sandquist, NP

## 2020-06-28 IMAGING — CT CT ABDOMEN AND PELVIS WITH CONTRAST
2 of 5 series · 16 of 46 positions shown, 18 images · IV contrast (APPLIED)
Comparison: Abdominal ultrasound

CLINICAL DATA: Pancreatitis

EXAM:
CT ABDOMEN AND PELVIS WITH CONTRAST
TECHNIQUE: Multidetector CT imaging of the abdomen and pelvis was performed
using the standard protocol following bolus administration of
intravenous contrast.
CONTRAST:  75mL OMNIPAQUE IOHEXOL 300 MG/ML  SOLN

[Series 2: routine abd/pel with · axial · 0.62mm/px · z∈[-883,-503]mm · 13 of 86 slices shown, 15 images]
[im 5/86  soft-tissue]
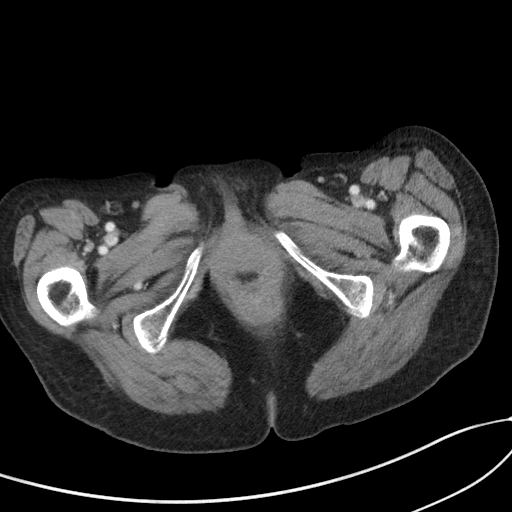
[im 5/86  bone]
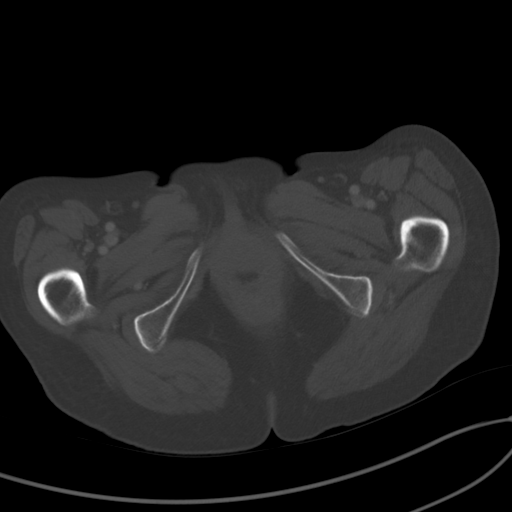
[im 13/86  soft-tissue]
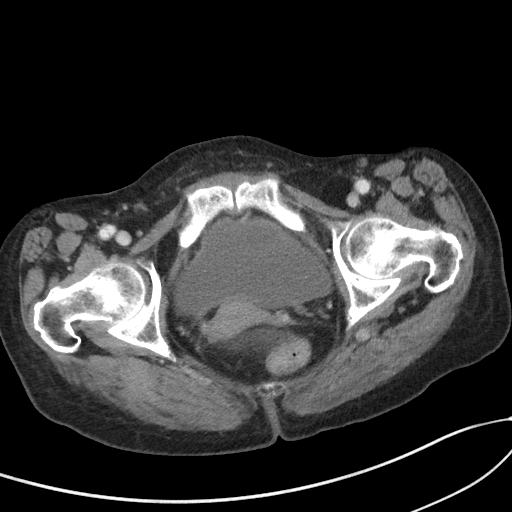
[im 18/86  soft-tissue]
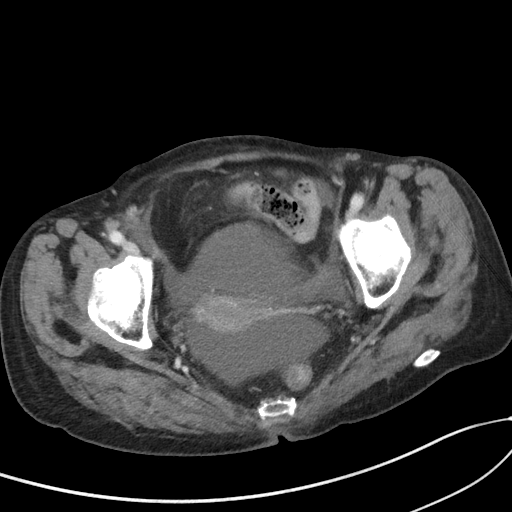
[im 26/86  soft-tissue]
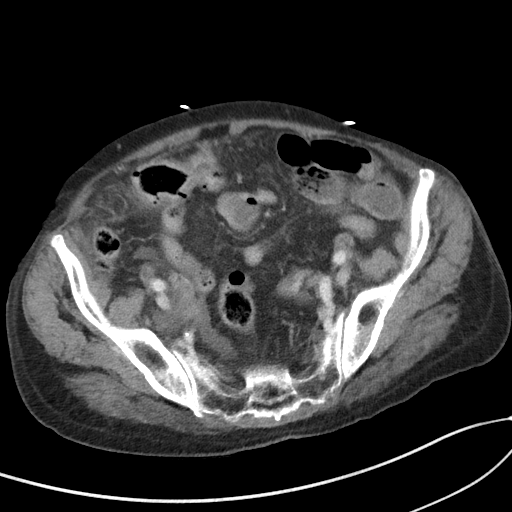
[im 30/86  soft-tissue]
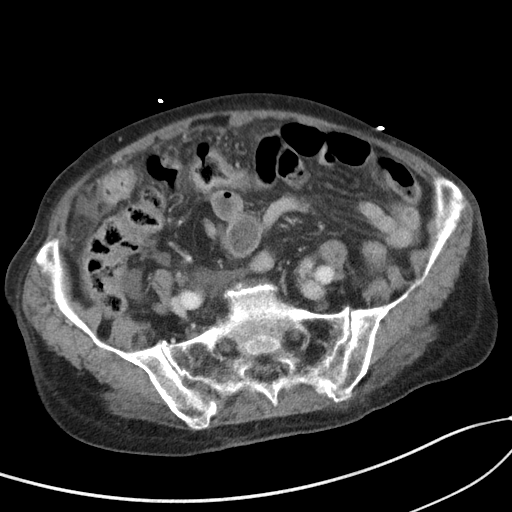
[im 39/86  soft-tissue]
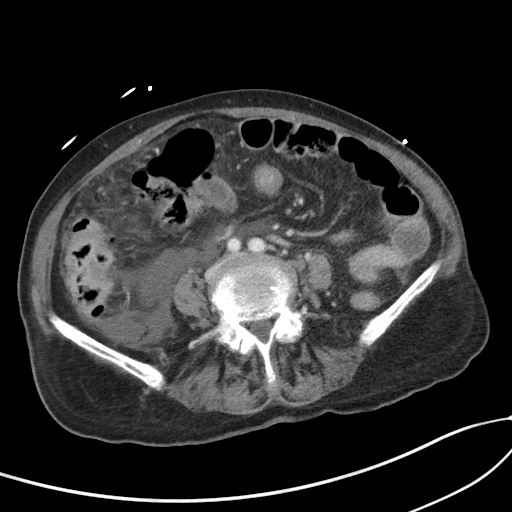
[im 43/86  soft-tissue]
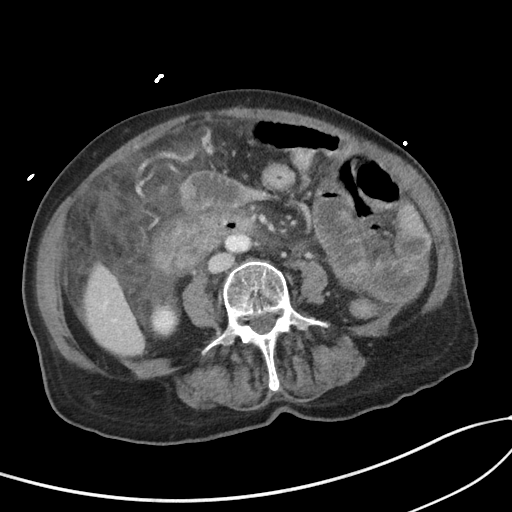
[im 47/86  soft-tissue]
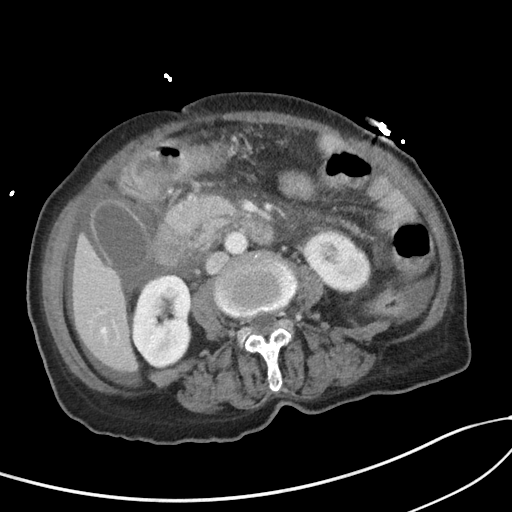
[im 56/86  soft-tissue]
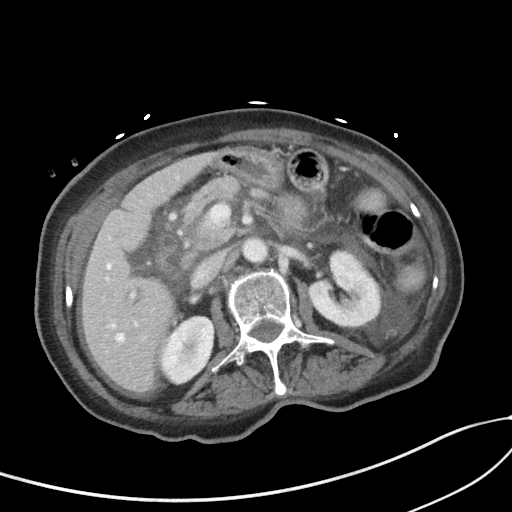
[im 56/86  bone]
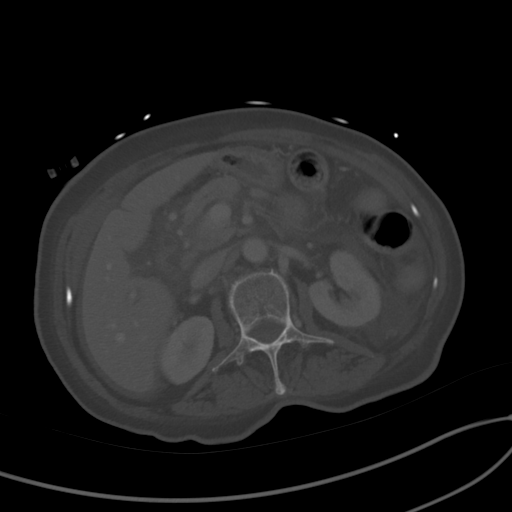
[im 60/86  soft-tissue]
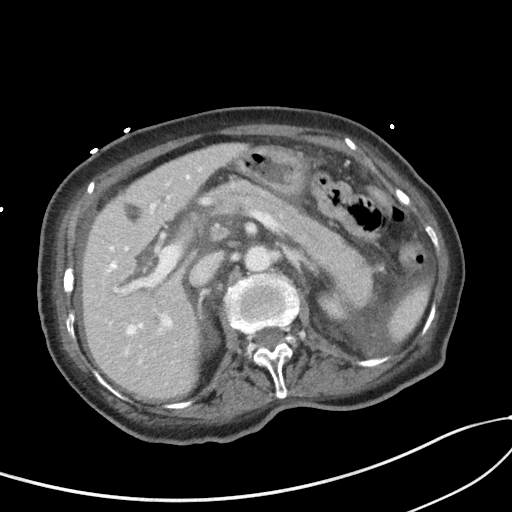
[im 69/86  soft-tissue]
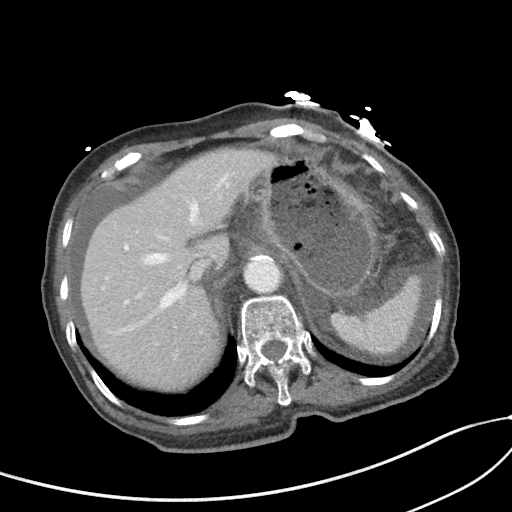
[im 73/86  soft-tissue]
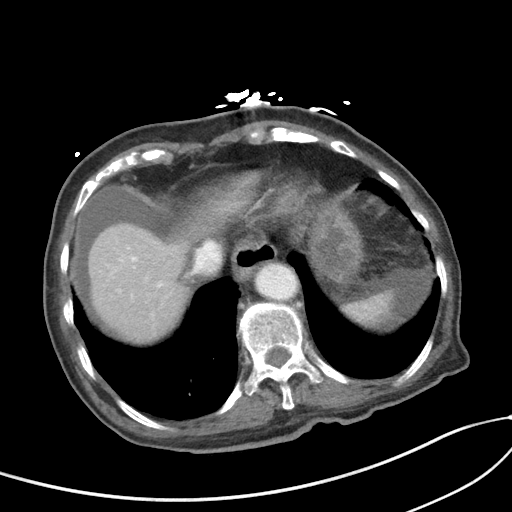
[im 81/86  soft-tissue]
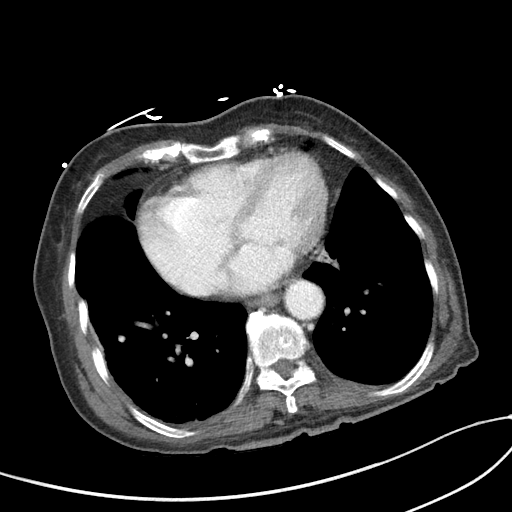

[Series 5: coronal st · coronal · 0.65mm/px · 3 of 70 slices shown]
[im 24/70  soft-tissue]
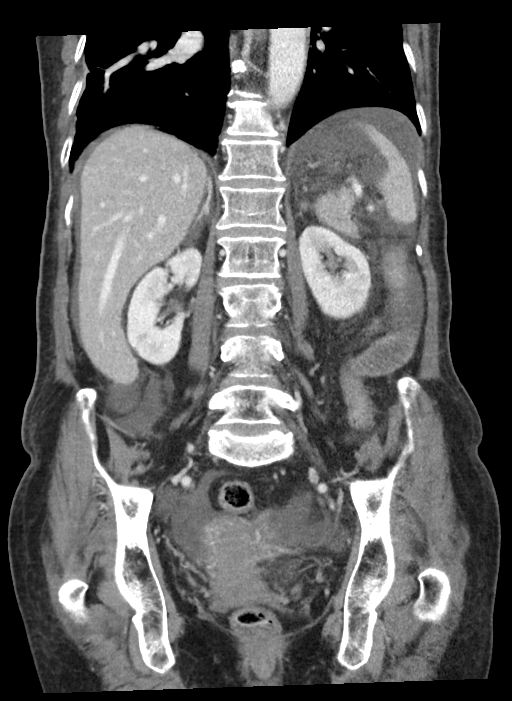
[im 31/70  soft-tissue]
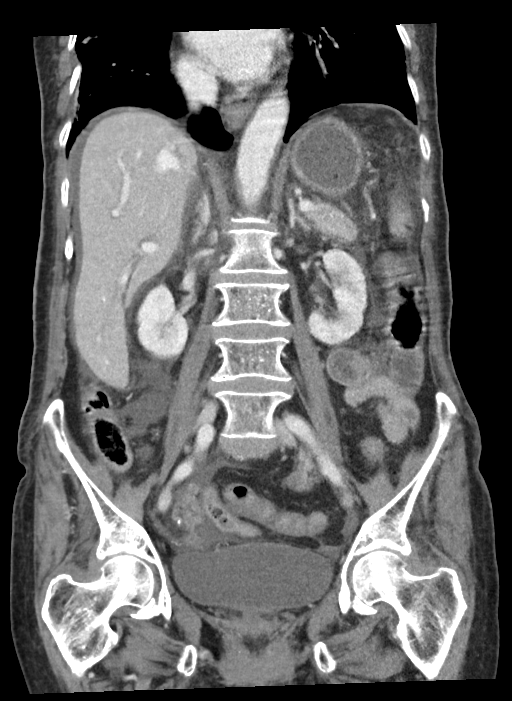
[im 39/70  soft-tissue]
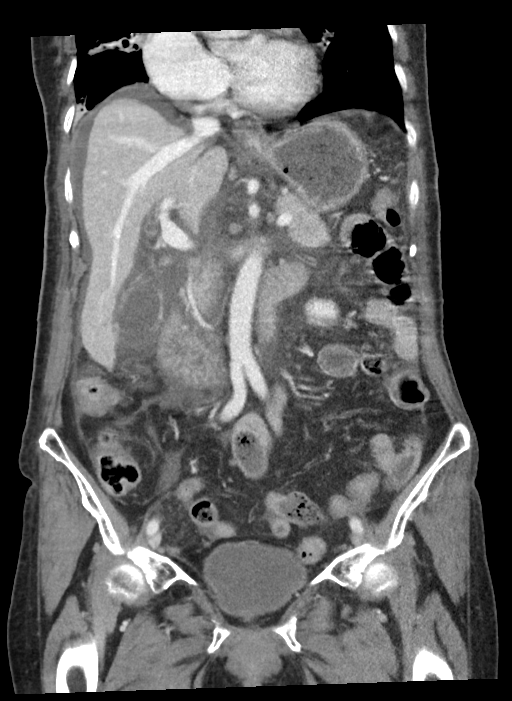

[16 of 46 positions shown; findings below may reference images not displayed]

FINDINGS: Lower chest: Opacity with bronchiectasis in the lingula and right
middle lobe. Micronodular airspace disease in the lower lobes. These
findings are centrally stable from 5809 chest CT.

Hepatobiliary: Small cystic density in the right liver. No
significant liver finding.Edematous gallbladder wall. No calcified
gallstone.

Pancreas: Pancreatic and peripancreatic edema correlating with
lipase levels. No ductal dilatation taken or organized collection.

Spleen: Linear capsular calcification along the lateral spleen
attributed to remote insult.

Adrenals/Urinary Tract: Negative adrenals. No hydronephrosis or
stone. Unremarkable bladder.

Stomach/Bowel: No obstruction. No inflammatory bowel wall
thickening.

Vascular/Lymphatic: No acute vascular abnormality. No mass or
adenopathy.

Reproductive:Negative.

Other: Small volume ascites that is considered reactive. Fatty left
femoral hernia.

Musculoskeletal: No acute abnormalities.
IMPRESSION: 1. Pancreatitis without organized collection or necrosis.
2. Small volume ascites.
3. Chronic lung disease as seen with FERMAINT infection.
4. Fatty left femoral hernia.

## 2022-10-12 ENCOUNTER — Emergency Department: Payer: Medicare Other

## 2022-10-12 ENCOUNTER — Encounter: Payer: Self-pay | Admitting: Radiology

## 2022-10-12 ENCOUNTER — Inpatient Hospital Stay
Admission: EM | Admit: 2022-10-12 | Discharge: 2022-10-15 | DRG: 418 | Disposition: A | Discharge status: Home / Self Care | Payer: Medicare Other | Attending: Hospitalist | Admitting: Hospitalist

## 2022-10-12 ENCOUNTER — Other Ambulatory Visit: Payer: Self-pay

## 2022-10-12 DIAGNOSIS — K851 Biliary acute pancreatitis without necrosis or infection: Secondary | ICD-10-CM | POA: Diagnosis not present

## 2022-10-12 DIAGNOSIS — D72829 Elevated white blood cell count, unspecified: Secondary | ICD-10-CM | POA: Insufficient documentation

## 2022-10-12 DIAGNOSIS — K859 Acute pancreatitis without necrosis or infection, unspecified: Secondary | ICD-10-CM | POA: Diagnosis present

## 2022-10-12 DIAGNOSIS — E785 Hyperlipidemia, unspecified: Secondary | ICD-10-CM | POA: Diagnosis present

## 2022-10-12 DIAGNOSIS — I10 Essential (primary) hypertension: Secondary | ICD-10-CM | POA: Diagnosis present

## 2022-10-12 DIAGNOSIS — Z87891 Personal history of nicotine dependence: Secondary | ICD-10-CM | POA: Diagnosis not present

## 2022-10-12 DIAGNOSIS — K660 Peritoneal adhesions (postprocedural) (postinfection): Secondary | ICD-10-CM | POA: Diagnosis present

## 2022-10-12 DIAGNOSIS — K8020 Calculus of gallbladder without cholecystitis without obstruction: Secondary | ICD-10-CM | POA: Insufficient documentation

## 2022-10-12 DIAGNOSIS — K8010 Calculus of gallbladder with chronic cholecystitis without obstruction: Principal | ICD-10-CM | POA: Diagnosis present

## 2022-10-12 DIAGNOSIS — Z7982 Long term (current) use of aspirin: Secondary | ICD-10-CM | POA: Diagnosis not present

## 2022-10-12 DIAGNOSIS — K8080 Other cholelithiasis without obstruction: Secondary | ICD-10-CM | POA: Diagnosis not present

## 2022-10-12 DIAGNOSIS — J45909 Unspecified asthma, uncomplicated: Secondary | ICD-10-CM | POA: Diagnosis present

## 2022-10-12 LAB — URINALYSIS, ROUTINE W REFLEX MICROSCOPIC
Bilirubin Urine: NEGATIVE
Glucose, UA: NEGATIVE mg/dL
Ketones, ur: 20 mg/dL — AB
Nitrite: NEGATIVE
Protein, ur: 100 mg/dL — AB
Specific Gravity, Urine: 1.024 (ref 1.005–1.030)
pH: 5 (ref 5.0–8.0)

## 2022-10-12 LAB — CBC WITH DIFFERENTIAL/PLATELET
Abs Immature Granulocytes: 0.08 10*3/uL — ABNORMAL HIGH (ref 0.00–0.07)
Basophils Absolute: 0.1 10*3/uL (ref 0.0–0.1)
Basophils Relative: 0 %
Eosinophils Absolute: 0.1 10*3/uL (ref 0.0–0.5)
Eosinophils Relative: 1 %
HCT: 38.7 % (ref 36.0–46.0)
Hemoglobin: 12.5 g/dL (ref 12.0–15.0)
Immature Granulocytes: 1 %
Lymphocytes Relative: 4 %
Lymphs Abs: 0.7 10*3/uL (ref 0.7–4.0)
MCH: 27.9 pg (ref 26.0–34.0)
MCHC: 32.3 g/dL (ref 30.0–36.0)
MCV: 86.4 fL (ref 80.0–100.0)
Monocytes Absolute: 1.2 10*3/uL — ABNORMAL HIGH (ref 0.1–1.0)
Monocytes Relative: 7 %
Neutro Abs: 14.5 10*3/uL — ABNORMAL HIGH (ref 1.7–7.7)
Neutrophils Relative %: 87 %
Platelets: 170 10*3/uL (ref 150–400)
RBC: 4.48 MIL/uL (ref 3.87–5.11)
RDW: 14.8 % (ref 11.5–15.5)
WBC: 16.8 10*3/uL — ABNORMAL HIGH (ref 4.0–10.5)
nRBC: 0 % (ref 0.0–0.2)

## 2022-10-12 LAB — COMPREHENSIVE METABOLIC PANEL
ALT: 92 U/L — ABNORMAL HIGH (ref 0–44)
AST: 60 U/L — ABNORMAL HIGH (ref 15–41)
Albumin: 3.3 g/dL — ABNORMAL LOW (ref 3.5–5.0)
Alkaline Phosphatase: 100 U/L (ref 38–126)
Anion gap: 10 (ref 5–15)
BUN: 21 mg/dL (ref 8–23)
CO2: 26 mmol/L (ref 22–32)
Calcium: 8.3 mg/dL — ABNORMAL LOW (ref 8.9–10.3)
Chloride: 96 mmol/L — ABNORMAL LOW (ref 98–111)
Creatinine, Ser: 0.87 mg/dL (ref 0.44–1.00)
GFR, Estimated: >60 mL/min (ref >60)
Glucose, Bld: 94 mg/dL (ref 70–99)
Potassium: 3.6 mmol/L (ref 3.5–5.1)
Sodium: 132 mmol/L — ABNORMAL LOW (ref 135–145)
Total Bilirubin: 1.6 mg/dL — ABNORMAL HIGH (ref 0.3–1.2)
Total Protein: 7.2 g/dL (ref 6.5–8.1)

## 2022-10-12 LAB — TRIGLYCERIDES: Triglycerides: 79 mg/dL (ref <150)

## 2022-10-12 LAB — LIPASE, BLOOD: Lipase: 55 U/L — ABNORMAL HIGH (ref 11–51)

## 2022-10-12 MED ORDER — ONDANSETRON HCL 4 MG/2ML IJ SOLN
4.0000 mg | Freq: Once | INTRAMUSCULAR | Status: AC
  Administered 2022-10-12: 4 mg via INTRAVENOUS
  Filled 2022-10-12: qty 2

## 2022-10-12 MED ORDER — ONDANSETRON HCL 4 MG/2ML IJ SOLN: 4.0000 mg | Freq: Four times a day (QID) | INTRAMUSCULAR | Status: DC | PRN

## 2022-10-12 MED ORDER — ONDANSETRON HCL 4 MG PO TABS: 4.0000 mg | ORAL_TABLET | Freq: Four times a day (QID) | ORAL | Status: DC | PRN

## 2022-10-12 MED ORDER — LACTATED RINGERS IV BOLUS
1000.0000 mL | Freq: Once | INTRAVENOUS | Status: AC
  Administered 2022-10-12: 1000 mL via INTRAVENOUS

## 2022-10-12 MED ORDER — ORAL CARE MOUTH RINSE: 15.0000 mL | OROMUCOSAL | Status: DC | PRN

## 2022-10-12 MED ORDER — SODIUM CHLORIDE 0.9 % IV SOLN: INTRAVENOUS | Status: DC

## 2022-10-12 MED ORDER — HYDROMORPHONE HCL 1 MG/ML IJ SOLN
0.5000 mg | INTRAMUSCULAR | Status: DC | PRN
  Administered 2022-10-13: 0.5 mg via INTRAVENOUS
  Filled 2022-10-12: qty 0.5

## 2022-10-12 MED ORDER — ENOXAPARIN SODIUM 30 MG/0.3ML IJ SOSY
30.0000 mg | PREFILLED_SYRINGE | INTRAMUSCULAR | Status: DC
  Administered 2022-10-12 – 2022-10-14 (×3): 30 mg via SUBCUTANEOUS
  Filled 2022-10-12 (×3): qty 0.3

## 2022-10-12 MED ORDER — HYDROMORPHONE HCL 1 MG/ML IJ SOLN
0.5000 mg | Freq: Once | INTRAMUSCULAR | Status: AC
  Administered 2022-10-12: 0.5 mg via INTRAVENOUS
  Filled 2022-10-12: qty 0.5

## 2022-10-12 MED ORDER — IOHEXOL 300 MG/ML  SOLN
100.0000 mL | Freq: Once | INTRAMUSCULAR | Status: AC | PRN
  Administered 2022-10-12: 100 mL via INTRAVENOUS

## 2022-10-12 NOTE — Assessment & Plan Note (Signed)
White count 16.8 on presentation Suspect secondary to active pancreatitis Currently afebrile Will monitor for now Reassess antibiotic coverage if patient spikes a fever Trend with treatment Follow closely

## 2022-10-12 NOTE — Assessment & Plan Note (Signed)
Worsening abdominal pain over the past 3 to 4 days predominantly in periumbilical area Lipase minimally elevated in the 50s at present with also minimal elevation of AST and ALT at 60 and 92 respectively CT imaging with pancreatic head inflammation as well as cholelithiasis Concern for gallstone pancreatitis with abdominal ultrasound pending Per ER physician, case discussed with on-call surgeon Dr. Christian Mate who would like to do elective cholecystectomy once pancreatitis resolves Will monitor for now Pain control, antiemetics, IV fluids Add on triglyceride level Follow

## 2022-10-12 NOTE — Assessment & Plan Note (Addendum)
Stable from respiratory standpoint Continue inhalers Follow

## 2022-10-12 NOTE — ED Notes (Signed)
Called lab to addon triglycerides

## 2022-10-12 NOTE — Assessment & Plan Note (Deleted)
Worsening abdominal pain over the past 3 to 4 days predominantly in periumbilical area Lipase minimally elevated in the 50s at present with also minimal elevation of AST and ALT at 60 and 92 respectively CT imaging with pancreatic head inflammation as well as cholelithiasis Concern for gallstone pancreatitis with abdominal ultrasound pending Per ER physician, case discussed with on-call surgeon Dr. Christian Mate who would like to do elective cholecystectomy once pancreatitis resolves Will monitor for now Pain control, antiemetics, IV fluids Add on triglyceride level Follow

## 2022-10-12 NOTE — H&P (Addendum)
History and Physical    Patient: Jill Abbott [id number] DOB: [birth year] DOA: 10/12/2022 DOS: the patient was seen and examined on 10/12/2022 PCP: Albertina Parr, MD  Patient coming from: Home  Chief Complaint:  Chief Complaint  Patient presents with   Abdominal Pain   HPI: Jill Abbott is a 82 y.o. female with medical history significant of asthma, hyperlipidemia, hypertension presenting with pancreatitis.  Noted Spanish translator via iPad used to communicate with patient as well as daughter at the bedside.  Per report, patient with worsening abdominal pain since approximately 4 days ago.  Abdominal pain is predominately periumbilical with radiation to the sides bilaterally.  Positive nausea as well as decreased p.o. intake.  Had 1 episode of vomiting that was yellowish in color.  No fevers or chills.  No diarrhea.  No chest pain or shortness of breath.  No hemiparesis or confusion.  Noted similar admission approximately 4 years ago for pancreatitis in the setting of cholecystitis.  No reported recent change in diet.  Decreased p.o. intake and malaise has progressively worsened over the past 2 to 3 days. No reported dysuria.  Presented to the ER Tmax 99, otherwise hemodynamically stable. WBC 17, hgb 12.5, Cr 0.9, AST 60, ALT 92, T bili 1.6. Lipase 55. UA minimally indicative of infection.  CT abdomen pelvis with mild Flamm Tory changes noted in the pancreatic head concerning for pancreatitis with mesenteric edema as well as cholelithiasis.  Abdominal ultrasound is pending.  Per ER physician Dr. Wonda Cerise, case discussed with on-call general surgeon Dr. Christian Mate.  Would like to perform cholecystectomy once pancreatitis resolves. Review of Systems: As mentioned in the history of present illness. All other systems reviewed and are negative. Past Medical History:  Diagnosis Date   Asthma    HLD (hyperlipidemia)    Hypertension    Past Surgical History:  Procedure Laterality  Date   HERNIA REPAIR     Social History:  reports that she quit smoking about 8 years ago. She has never used smokeless tobacco. She reports that she does not drink alcohol. No history on file for drug use.  No Known Allergies  No family history on file.  Prior to Admission medications   Medication Sig Start Date End Date Taking? Authorizing Provider  amLODipine (NORVASC) 5 MG tablet Take 5 mg by mouth daily. 01/17/18   [provider]  aspirin 81 MG EC tablet Take 81 mg by mouth daily. 01/17/18   [provider]  atorvastatin (LIPITOR) 20 MG tablet Take 20 mg by mouth daily. 01/17/18   [provider]  CALCI-CHEW 1250 (500 Ca) MG chewable tablet Chew 1 tablet by mouth 2 (two) times daily. 01/14/18   [provider]  [employer] D3 1000 units capsule Take 1,000 Units by mouth 2 (two) times daily. 01/17/18   [provider]  diclofenac sodium (VOLTAREN) 1 % GEL Apply 2 g topically 4 (four) times daily. 01/09/18   [provider]  guaiFENesin-dextromethorphan (ROBITUSSIN DM) 100-10 MG/5ML syrup Take 5 mLs by mouth every 4 (four) hours as needed for cough. Patient not taking: Reported on 10/10/2018 02/22/18   Saundra Shelling, MD  magnesium hydroxide (MILK OF MAGNESIA) 400 MG/5ML suspension Take 30 mLs by mouth daily as needed for mild constipation. 10/15/18   Max Sane, MD  SPIRIVA HANDIHALER 18 MCG inhalation capsule Place 18 mcg into inhaler and inhale daily. 02/13/18   [provider]  triamterene-hydrochlorothiazide (MAXZIDE-25) 37.5-25 MG tablet Take 1 tablet by mouth daily.  08/18/18 08/18/19  [provider]  Trolamine Salicylate 10 % LOTN Apply to painful area twice a day 08/18/18   [provider]    Physical Exam: Vitals:   10/12/22 1248 10/12/22 1700  BP: 131/63   Pulse: 92   Resp: 16   Temp: 99 F (37.2 C)   TempSrc: Oral   SpO2: 94%   Weight:  38.4 kg  Height:  4' 11.84" (1.52 m)   Physical Exam Constitutional:       General: She is not in acute distress.    Appearance: She is normal weight.  HENT:     Head: Normocephalic and atraumatic.     Mouth/Throat:     Mouth: Mucous membranes are moist.  Eyes:     Pupils: Pupils are equal, round, and reactive to light.  Cardiovascular:     Rate and Rhythm: Normal rate and regular rhythm.  Pulmonary:     Effort: Pulmonary effort is normal.  Abdominal:     General: Bowel sounds are normal.     Tenderness: There is no guarding.     Comments: Minimal to mild lower abdominal tenderness to palpation  Musculoskeletal:        General: Normal range of motion.  Skin:    General: Skin is warm.  Neurological:     General: No focal deficit present.  Psychiatric:        Mood and Affect: Mood normal.     Data Reviewed:  There are no new results to review at this time. CT ABDOMEN PELVIS W CONTRAST CLINICAL DATA:  Acute lower abdominal pain.  EXAM: CT ABDOMEN AND PELVIS WITH CONTRAST  TECHNIQUE: Multidetector CT imaging of the abdomen and pelvis was performed using the standard protocol following bolus administration of intravenous contrast.  RADIATION DOSE REDUCTION: This exam was performed according to the departmental dose-optimization program which includes automated exposure control, adjustment of the mA and/or kV according to patient size and/or use of iterative reconstruction technique.  CONTRAST:  155mL OMNIPAQUE IOHEXOL 300 MG/ML  SOLN  COMPARISON:  October 10, 2018.  FINDINGS: Lower chest: No acute abnormality.  Hepatobiliary: Cholelithiasis is noted. No biliary dilatation is noted. The liver is unremarkable.  Pancreas: No ductal dilatation or pseudocyst formation is noted. Mild inflammatory changes are noted around the pancreatic head suggesting pancreatitis.  Spleen: Normal in size without focal abnormality.  Adrenals/Urinary Tract: Adrenal glands are unremarkable. Kidneys are normal, without renal calculi, focal lesion, or  hydronephrosis. Bladder is unremarkable.  Stomach/Bowel: Stomach is within normal limits. Appendix appears normal. No evidence of bowel wall thickening, distention, or inflammatory changes.  Vascular/Lymphatic: No significant vascular findings are present. No enlarged abdominal or pelvic lymph nodes.  Reproductive: Uterus and bilateral adnexa are unremarkable.  Other: Large amount of edema is noted in the mesenteric region as well as mild ascites in the pelvis and around the liver and spleen.  Musculoskeletal: Old L4 compression fracture is noted. No acute osseous abnormality is noted.  IMPRESSION: Mild inflammatory changes are noted around the pancreatic head suggesting acute pancreatitis. No pseudocyst formation is noted. There does appear to be a moderate amount of mesenteric edema as well as well as mild ascites seen in the pelvis and around the liver and spleen.  Cholelithiasis.  Electronically Signed   By: Marijo Conception M.D.   On: 10/12/2022 15:05  Lab Results  Component Value Date   WBC 16.8 (H) 10/12/2022   HGB 12.5 10/12/2022   HCT 38.7 10/12/2022  MCV 86.4 10/12/2022   PLT 170 0000000   Last metabolic panel Lab Results  Component Value Date   GLUCOSE 94 10/12/2022   NA 132 (L) 10/12/2022   K 3.6 10/12/2022   CL 96 (L) 10/12/2022   CO2 26 10/12/2022   BUN 21 10/12/2022   CREATININE 0.87 10/12/2022   GFRNONAA >60 10/12/2022   CALCIUM 8.3 (L) 10/12/2022   PROT 7.2 10/12/2022   ALBUMIN 3.3 (L) 10/12/2022   BILITOT 1.6 (H) 10/12/2022   ALKPHOS 100 10/12/2022   AST 60 (H) 10/12/2022   ALT 92 (H) 10/12/2022   ANIONGAP 10 10/12/2022    Assessment and Plan: Acute pancreatitis Worsening abdominal pain over the past 3 to 4 days predominantly in periumbilical area Lipase minimally elevated in the 50s at present with also minimal elevation of AST and ALT at 60 and 92 respectively CT imaging with pancreatic head inflammation as well as  cholelithiasis Concern for gallstone pancreatitis with abdominal ultrasound pending Per ER physician, case discussed with on-call surgeon Dr. Christian Mate who would like to do elective cholecystectomy once pancreatitis resolves Will monitor for now Pain control, antiemetics, IV fluids Add on triglyceride level Follow  Cholelithiasis Noted cholelthiasis w/ active pancreatitis  Some ? Gallstone pancreatitis  Pending abd u/s to better assess  Dr. Christian Mate w/ general surgery aware of case  Tentatively planning for cholecystectomy pending resolution of pancreatitis.  Follow up formal recommendations and abd u/s.   Leukocytosis White count 16.8 on presentation Suspect secondary to active pancreatitis Currently afebrile Will monitor for now Reassess antibiotic coverage if patient spikes a fever Trend with treatment Follow closely  Asthma Stable from respiratory standpoint Continue inhalers Follow  HTN (hypertension) BP stable Titrate home regimen pending medication verification       Advance Care Planning:   Code Status: Full Code   Consults: General surgery w/ Dr. Christian Mate per Dr. Wonda Cerise   Family Communication: Daughter at the bedside   Severity of Illness: The appropriate patient status for this patient is INPATIENT. Inpatient status is judged to be reasonable and necessary in order to provide the required intensity of service to ensure the patient's safety. The patient's presenting symptoms, physical exam findings, and initial radiographic and laboratory data in the context of their chronic comorbidities is felt to place them at high risk for further clinical deterioration. Furthermore, it is not anticipated that the patient will be medically stable for discharge from the hospital within 2 midnights of admission.   * I certify that at the point of admission it is my clinical judgment that the patient will require inpatient hospital care spanning beyond 2 midnights from the  point of admission due to high intensity of service, high risk for further deterioration and high frequency of surveillance required.*  Author: Deneise Lever, MD 10/12/2022 5:10 PM  For on call review [email or web address].

## 2022-10-12 NOTE — Assessment & Plan Note (Signed)
Noted cholelthiasis w/ active pancreatitis  Some ? Gallstone pancreatitis  Pending abd u/s to better assess  Dr. Christian Mate w/ general surgery aware of case  Tentatively planning for cholecystectomy pending resolution of pancreatitis.  Follow up formal recommendations and abd u/s.

## 2022-10-12 NOTE — ED Provider Notes (Signed)
[facility] Provider Note    Event Date/Time   First MD Initiated Contact with Patient 10/12/22 1256     (approximate)   History   Abdominal Pain   HPI  Jill Abbott is a 82 y.o. female  with pmh asthma, HLS, HTN who p/w abdominal pain.  Patient tells me the symptoms started on Tuesday initially with nausea and several episodes of emesis that has since resolved.  She then developed abdominal pain and is both the left upper and right lower quadrants.  It is been constant but is worsening.  Denies fevers chills no urinary symptoms no ongoing emesis after the first day of symptoms.  She is having normal bowel movements.  Denies history of similar pain.     Past Medical History:  Diagnosis Date   Asthma    HLD (hyperlipidemia)    Hypertension     Patient Active Problem List   Diagnosis Date Noted   Palliative care encounter 12/12/2018   Acute pancreatitis 10/10/2018   HTN (hypertension) 02/21/2018   HLD (hyperlipidemia) 02/21/2018   CAP (community acquired pneumonia) 02/21/2018   Asthma 02/21/2018     Physical Exam  Triage Vital Signs: ED Triage Vitals  Enc Vitals Group     BP 10/12/22 1248 131/63     Pulse Rate 10/12/22 1248 92     Resp 10/12/22 1248 16     Temp 10/12/22 1248 99 F (37.2 C)     Temp Source 10/12/22 1248 Oral     SpO2 10/12/22 1248 94 %     Weight --      Height --      Head Circumference --      Peak Flow --      Pain Score 10/12/22 1251 4     Pain Loc --      Pain Edu? --      Excl. in [place]? --     Most recent vital signs: Vitals:   10/12/22 1248  BP: 131/63  Pulse: 92  Resp: 16  Temp: 99 F (37.2 C)  SpO2: 94%     General: Awake, no distress.  Patient looks well CV:  Good peripheral perfusion.  Resp:  Normal effort.  Abd:  No distention.  Tender to palpation both bilateral lower quadrants with no guarding abdomen soft Neuro:             Awake, Alert, Oriented x 3  Other:     ED Results /  Procedures / Treatments  Labs (all labs ordered are listed, but only abnormal results are displayed) Labs Reviewed  COMPREHENSIVE METABOLIC PANEL - Abnormal; Notable for the following components:      Result Value   Sodium 132 (*)    Chloride 96 (*)    Calcium 8.3 (*)    Albumin 3.3 (*)    AST 60 (*)    ALT 92 (*)    Total Bilirubin 1.6 (*)    All other components within normal limits  CBC WITH DIFFERENTIAL/PLATELET - Abnormal; Notable for the following components:   WBC 16.8 (*)    Neutro Abs 14.5 (*)    Monocytes Absolute 1.2 (*)    Abs Immature Granulocytes 0.08 (*)    All other components within normal limits  LIPASE, BLOOD - Abnormal; Notable for the following components:   Lipase 55 (*)    All other components within normal limits  URINALYSIS, ROUTINE W REFLEX MICROSCOPIC - Abnormal; Notable for the following  components:   Color, Urine AMBER (*)    APPearance HAZY (*)    Hgb urine dipstick MODERATE (*)    Ketones, ur 20 (*)    Protein, ur 100 (*)    Leukocytes,Ua MODERATE (*)    Bacteria, UA RARE (*)    All other components within normal limits     EKG     RADIOLOGY I reviewed interpreted CT of the abdomen pelvis which shows pancreatic inflammation   PROCEDURES:  Critical Care performed: No  Procedures   MEDICATIONS ORDERED IN ED: Medications  lactated ringers bolus 1,000 mL (has no administration in time range)  HYDROmorphone (DILAUDID) injection 0.5 mg (0.5 mg Intravenous Given 10/12/22 1338)  ondansetron (ZOFRAN) injection 4 mg (4 mg Intravenous Given 10/12/22 1337)  iohexol (OMNIPAQUE) 300 MG/ML solution 100 mL (100 mLs Intravenous Contrast Given 10/12/22 1442)     IMPRESSION / MDM / ASSESSMENT AND PLAN / ED COURSE  I reviewed the triage vital signs and the nursing notes.                              Patient's presentation is most consistent with acute complicated illness / injury requiring diagnostic workup.  Differential diagnosis includes,  but is not limited to, diverticulitis, appendicitis, pancreatitis, cystitis, bowel perforation, pyelonephritis, UTI  Patient is a 82 year old female history of hypertension hyperlipidemia with prior episode of pancreatitis who presents with abdominal pain.  This has been going on since Tuesday, for 3 days.  It is in both lower quadrants initially was associate with nausea vomiting without a since resolved.  No fevers or urinary symptoms.  Patient seen in outpatient clinic referred to ED for tachycardia and guarding on abdominal exam.  Here patient's vitals are reassuring heart rate is normal she looks well nontoxic does have diffuse tenderness primarily in the lower quadrants with no guarding.  Abdominal exam is soft.  Will obtain CBC CMP lipase urinalysis and CT abdomen pelvis.  Will give Dilaudid for pain Zofran for nausea.  Patient has leukocytosis to 16.  T. bili just mildly elevated 1.6 AST and ALT just mildly bumped.  Lipase is 55.  CT of the abdomen pelvis shows inflammation the pancreatic head likely from acute pancreatitis.  There is also some ascites in the abdomen.  Reassessed patient she is feeling improved.  Discussed results with patient and she is asking if it is required that she stay in the hospital.  She does have gallstones on her CT so I suspect this is likely gallstone pancreatitis.  Discussed with Dr. Christian Mate about whether it still the standard to have the gallbladder removed during index admission for pancreatitis and he does feel that this is the best plan.  Patient is agreeable to stay.  Will give fluid bolus.     FINAL CLINICAL IMPRESSION(S) / ED DIAGNOSES   Final diagnoses:  Acute biliary pancreatitis, unspecified complication status     Rx / DC Orders   ED Discharge Orders     None        Note:  This document was prepared using Dragon voice recognition software and may include unintentional dictation errors.   Rada Hay, MD 10/12/22 [phone]

## 2022-10-12 NOTE — Assessment & Plan Note (Addendum)
BP stable Titrate home regimen pending medication verification

## 2022-10-13 ENCOUNTER — Encounter: Payer: Self-pay | Admitting: Family Medicine

## 2022-10-13 ENCOUNTER — Encounter
Admission: EM | Disposition: A | Discharge status: Home / Self Care | Payer: Self-pay | Source: Home / Self Care | Attending: Hospitalist

## 2022-10-13 ENCOUNTER — Inpatient Hospital Stay: Payer: Medicare Other | Admitting: Registered Nurse

## 2022-10-13 ENCOUNTER — Other Ambulatory Visit: Payer: Self-pay

## 2022-10-13 DIAGNOSIS — K851 Biliary acute pancreatitis without necrosis or infection: Secondary | ICD-10-CM | POA: Diagnosis not present

## 2022-10-13 DIAGNOSIS — K8010 Calculus of gallbladder with chronic cholecystitis without obstruction: Secondary | ICD-10-CM | POA: Diagnosis not present

## 2022-10-13 LAB — CBC
HCT: 35 % — ABNORMAL LOW (ref 36.0–46.0)
Hemoglobin: 11.1 g/dL — ABNORMAL LOW (ref 12.0–15.0)
MCH: 27.9 pg (ref 26.0–34.0)
MCHC: 31.7 g/dL (ref 30.0–36.0)
MCV: 87.9 fL (ref 80.0–100.0)
Platelets: 164 10*3/uL (ref 150–400)
RBC: 3.98 MIL/uL (ref 3.87–5.11)
RDW: 14.9 % (ref 11.5–15.5)
WBC: 14.1 10*3/uL — ABNORMAL HIGH (ref 4.0–10.5)
nRBC: 0 % (ref 0.0–0.2)

## 2022-10-13 LAB — COMPREHENSIVE METABOLIC PANEL
ALT: 64 U/L — ABNORMAL HIGH (ref 0–44)
AST: 37 U/L (ref 15–41)
Albumin: 2.5 g/dL — ABNORMAL LOW (ref 3.5–5.0)
Alkaline Phosphatase: 81 U/L (ref 38–126)
Anion gap: 14 (ref 5–15)
BUN: 20 mg/dL (ref 8–23)
CO2: 23 mmol/L (ref 22–32)
Calcium: 8.1 mg/dL — ABNORMAL LOW (ref 8.9–10.3)
Chloride: 99 mmol/L (ref 98–111)
Creatinine, Ser: 0.73 mg/dL (ref 0.44–1.00)
GFR, Estimated: >60 mL/min (ref >60)
Glucose, Bld: 61 mg/dL — ABNORMAL LOW (ref 70–99)
Potassium: 3.6 mmol/L (ref 3.5–5.1)
Sodium: 136 mmol/L (ref 135–145)
Total Bilirubin: 1.6 mg/dL — ABNORMAL HIGH (ref 0.3–1.2)
Total Protein: 5.8 g/dL — ABNORMAL LOW (ref 6.5–8.1)

## 2022-10-13 SURGERY — CHOLECYSTECTOMY, ROBOT-ASSISTED, LAPAROSCOPIC
Anesthesia: General

## 2022-10-13 MED ORDER — PROPOFOL 10 MG/ML IV BOLUS
INTRAVENOUS | Status: AC
  Filled 2022-10-13: qty 20

## 2022-10-13 MED ORDER — ROCURONIUM BROMIDE 100 MG/10ML IV SOLN
INTRAVENOUS | Status: DC | PRN
  Administered 2022-10-13: 10 mg via INTRAVENOUS
  Administered 2022-10-13: 30 mg via INTRAVENOUS

## 2022-10-13 MED ORDER — CEFAZOLIN SODIUM-DEXTROSE 2-3 GM-%(50ML) IV SOLR
INTRAVENOUS | Status: DC | PRN
  Administered 2022-10-13: 2 g via INTRAVENOUS

## 2022-10-13 MED ORDER — FENTANYL CITRATE (PF) 100 MCG/2ML IJ SOLN
INTRAMUSCULAR | Status: AC
  Filled 2022-10-13: qty 2

## 2022-10-13 MED ORDER — LACTATED RINGERS IV SOLN: INTRAVENOUS | Status: DC | PRN

## 2022-10-13 MED ORDER — CEFAZOLIN SODIUM-DEXTROSE 2-4 GM/100ML-% IV SOLN: 2.0000 g | Freq: Once | INTRAVENOUS | Status: DC

## 2022-10-13 MED ORDER — SODIUM CHLORIDE (PF) 0.9 % IJ SOLN
INTRAMUSCULAR | Status: AC
  Filled 2022-10-13: qty 20

## 2022-10-13 MED ORDER — ONDANSETRON HCL 4 MG/2ML IJ SOLN
INTRAMUSCULAR | Status: DC | PRN
  Administered 2022-10-13: 4 mg via INTRAVENOUS

## 2022-10-13 MED ORDER — SODIUM CHLORIDE 0.9 % IR SOLN
Status: DC | PRN
  Administered 2022-10-13 (×2): 1500 mL

## 2022-10-13 MED ORDER — MORPHINE SULFATE (PF) 2 MG/ML IV SOLN: 1.0000 mg | INTRAVENOUS | Status: DC | PRN

## 2022-10-13 MED ORDER — FENTANYL CITRATE (PF) 100 MCG/2ML IJ SOLN
INTRAMUSCULAR | Status: DC | PRN
  Administered 2022-10-13: 50 ug via INTRAVENOUS
  Administered 2022-10-13: 25 ug via INTRAVENOUS

## 2022-10-13 MED ORDER — ROCURONIUM BROMIDE 10 MG/ML (PF) SYRINGE
PREFILLED_SYRINGE | INTRAVENOUS | Status: AC
  Filled 2022-10-13: qty 10

## 2022-10-13 MED ORDER — DEXAMETHASONE SODIUM PHOSPHATE 10 MG/ML IJ SOLN
INTRAMUSCULAR | Status: DC | PRN
  Administered 2022-10-13: 5 mg via INTRAVENOUS

## 2022-10-13 MED ORDER — LIDOCAINE HCL (PF) 2 % IJ SOLN
INTRAMUSCULAR | Status: AC
  Filled 2022-10-13: qty 5

## 2022-10-13 MED ORDER — FENTANYL CITRATE (PF) 100 MCG/2ML IJ SOLN
25.0000 ug | INTRAMUSCULAR | Status: DC | PRN
  Administered 2022-10-13: 25 ug via INTRAVENOUS

## 2022-10-13 MED ORDER — KETOROLAC TROMETHAMINE 15 MG/ML IJ SOLN
15.0000 mg | Freq: Once | INTRAMUSCULAR | Status: AC
  Administered 2022-10-13: 15 mg via INTRAVENOUS

## 2022-10-13 MED ORDER — INDOCYANINE GREEN 25 MG IV SOLR
1.2500 mg | Freq: Once | INTRAVENOUS | Status: AC
  Administered 2022-10-13: 1.25 mg via INTRAVENOUS
  Filled 2022-10-13: qty 0.5

## 2022-10-13 MED ORDER — ONDANSETRON HCL 4 MG/2ML IJ SOLN: 4.0000 mg | Freq: Once | INTRAMUSCULAR | Status: DC | PRN

## 2022-10-13 MED ORDER — HYDROMORPHONE HCL 1 MG/ML IJ SOLN: 0.5000 mg | INTRAMUSCULAR | Status: DC | PRN

## 2022-10-13 MED ORDER — ONDANSETRON HCL 4 MG/2ML IJ SOLN
INTRAMUSCULAR | Status: AC
  Filled 2022-10-13: qty 2

## 2022-10-13 MED ORDER — DEXAMETHASONE SODIUM PHOSPHATE 10 MG/ML IJ SOLN
INTRAMUSCULAR | Status: AC
  Filled 2022-10-13: qty 1

## 2022-10-13 MED ORDER — 0.9 % SODIUM CHLORIDE (POUR BTL) OPTIME
TOPICAL | Status: DC | PRN
  Administered 2022-10-13: 500 mL

## 2022-10-13 MED ORDER — LIDOCAINE HCL (CARDIAC) PF 100 MG/5ML IV SOSY
PREFILLED_SYRINGE | INTRAVENOUS | Status: DC | PRN
  Administered 2022-10-13: 50 mg via INTRAVENOUS

## 2022-10-13 MED ORDER — SUGAMMADEX SODIUM 200 MG/2ML IV SOLN
INTRAVENOUS | Status: DC | PRN
  Administered 2022-10-13: 153.6 mg via INTRAVENOUS

## 2022-10-13 MED ORDER — CEFAZOLIN SODIUM 1 G IJ SOLR
INTRAMUSCULAR | Status: AC
  Filled 2022-10-13: qty 20

## 2022-10-13 MED ORDER — BUPIVACAINE-EPINEPHRINE (PF) 0.25% -1:200000 IJ SOLN
INTRAMUSCULAR | Status: DC | PRN
  Administered 2022-10-13: 20 mL

## 2022-10-13 MED ORDER — PROPOFOL 10 MG/ML IV BOLUS
INTRAVENOUS | Status: DC | PRN
  Administered 2022-10-13: 50 mg via INTRAVENOUS

## 2022-10-13 MED ORDER — HYDROCODONE-ACETAMINOPHEN 5-325 MG PO TABS
1.0000 | ORAL_TABLET | ORAL | Status: DC | PRN
  Administered 2022-10-14: 2 via ORAL
  Administered 2022-10-15: 1 via ORAL
  Filled 2022-10-13: qty 1
  Filled 2022-10-13: qty 2
  Filled 2022-10-13: qty 1

## 2022-10-13 MED ORDER — KETOROLAC TROMETHAMINE 15 MG/ML IJ SOLN
INTRAMUSCULAR | Status: AC
  Filled 2022-10-13: qty 1

## 2022-10-13 SURGICAL SUPPLY — 51 items
BAG PRESSURE INF REUSE 3000 (BAG) IMPLANT
CLIP LIGATING HEM O LOK PURPLE (MISCELLANEOUS) ×1 IMPLANT
COVER TIP SHEARS 8 DVNC (MISCELLANEOUS) ×1 IMPLANT
COVER TIP SHEARS 8MM DA VINCI (MISCELLANEOUS) ×1
DEFOGGER SCOPE WARMER CLEARIFY (MISCELLANEOUS) IMPLANT
DERMABOND ADVANCED .7 DNX12 (GAUZE/BANDAGES/DRESSINGS) ×1 IMPLANT
DRAPE ARM DVNC X/XI (DISPOSABLE) ×4 IMPLANT
DRAPE COLUMN DVNC XI (DISPOSABLE) ×1 IMPLANT
DRAPE DA VINCI XI ARM (DISPOSABLE) ×4
DRAPE DA VINCI XI COLUMN (DISPOSABLE) ×1
ELECT CAUTERY BLADE 6.4 (BLADE) ×1 IMPLANT
GLOVE ORTHO TXT STRL SZ7.5 (GLOVE) ×2 IMPLANT
GOWN STRL REUS W/ TWL LRG LVL3 (GOWN DISPOSABLE) ×2 IMPLANT
GOWN STRL REUS W/ TWL XL LVL3 (GOWN DISPOSABLE) ×2 IMPLANT
GOWN STRL REUS W/TWL LRG LVL3 (GOWN DISPOSABLE) ×2
GOWN STRL REUS W/TWL XL LVL3 (GOWN DISPOSABLE) ×2
GRASPER SUT TROCAR 14GX15 (MISCELLANEOUS) ×1 IMPLANT
IRRIGATION STRYKERFLOW (MISCELLANEOUS) IMPLANT
IRRIGATOR STRYKERFLOW (MISCELLANEOUS) ×1
IRRIGATOR SUCT 8 DISP DVNC XI (IRRIGATION / IRRIGATOR) IMPLANT
IRRIGATOR SUCTION 8MM XI DISP (IRRIGATION / IRRIGATOR)
IV NS IRRIG 3000ML ARTHROMATIC (IV SOLUTION) IMPLANT
KIT PINK PAD W/HEAD ARE REST (MISCELLANEOUS) ×1 IMPLANT
KIT PINK PAD W/HEAD ARM REST (MISCELLANEOUS) ×1 IMPLANT
KIT TURNOVER KIT A (KITS) ×1 IMPLANT
LABEL OR SOLS (LABEL) ×1 IMPLANT
MANIFOLD NEPTUNE II (INSTRUMENTS) ×1 IMPLANT
NDL HYPO 22X1.5 SAFETY MO (MISCELLANEOUS) ×1 IMPLANT
NDL INSUFFLATION 14GA 120MM (NEEDLE) ×1 IMPLANT
NEEDLE HYPO 22X1.5 SAFETY MO (MISCELLANEOUS) ×1 IMPLANT
NEEDLE INSUFFLATION 14GA 120MM (NEEDLE) ×1 IMPLANT
NS IRRIG 500ML POUR BTL (IV SOLUTION) ×1 IMPLANT
OBTURATOR OPTICAL STANDARD 8MM (TROCAR) ×1
OBTURATOR OPTICAL STND 8 DVNC (TROCAR) ×1
OBTURATOR OPTICALSTD 8 DVNC (TROCAR) IMPLANT
PACK LAP CHOLECYSTECTOMY (MISCELLANEOUS) ×1 IMPLANT
SEAL UNIV 5-12 XI (MISCELLANEOUS) ×4 IMPLANT
SEAL XI UNIVERSAL 5-12 (MISCELLANEOUS) ×4
SET TUBE SMOKE EVAC HIGH FLOW (TUBING) ×1 IMPLANT
SOL ELECTROSURG ANTI STICK (MISCELLANEOUS) ×1
SOLUTION ELECTROSURG ANTI STCK (MISCELLANEOUS) ×1 IMPLANT
SPIKE FLUID TRANSFER (MISCELLANEOUS) ×1 IMPLANT
SUT MNCRL 4-0 (SUTURE) ×1
SUT MNCRL 4-0 27XMFL (SUTURE) ×1
SUT VICRYL 0 UR6 27IN ABS (SUTURE) ×1 IMPLANT
SUTURE MNCRL 4-0 27XMF (SUTURE) ×1 IMPLANT
SYS BAG RETRIEVAL 10MM (BASKET) ×1
SYSTEM BAG RETRIEVAL 10MM (BASKET) ×1 IMPLANT
TRAP FLUID SMOKE EVACUATOR (MISCELLANEOUS) ×1 IMPLANT
TROCAR Z-THREAD FIOS 11X100 BL (TROCAR) ×1 IMPLANT
WATER STERILE IRR 500ML POUR (IV SOLUTION) ×1 IMPLANT

## 2022-10-13 NOTE — Transfer of Care (Signed)
Immediate Anesthesia Transfer of Care Note  Patient: Jill Abbott  Procedure(s) Performed: Procedure(s): XI ROBOTIC ASSISTED LAPAROSCOPIC CHOLECYSTECTOMY (N/A)  Patient Location: PACU  Anesthesia Type:General  Level of Consciousness: sedated  Airway & Oxygen Therapy: Patient Spontanous Breathing and Patient connected to face mask oxygen  Post-op Assessment: Report given to RN and Post -op Vital signs reviewed and stable  Post vital signs: Reviewed and stable  Last Vitals:  Vitals:   10/13/22 0945 10/13/22 1551  BP: (!) 107/50 (!) 124/52  Pulse: 88 100  Resp: 18 (!) 22  Temp: 37 C 36.7 C  SpO2: 99991111 [id number]    Complications: No apparent anesthesia complications

## 2022-10-13 NOTE — Op Note (Signed)
Robotic cholecystectomy with Indocyamine Green Ductal Imaging.   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  Procedure: Robotic assisted laparoscopic cholecystectomy with Indocyamine Green Ductal Imaging.   Surgeon: Ronny Bacon, M.D., FACS  Anesthesia: General. with endotracheal tube  Findings: see photos, extensive scarring to omental tissues with copious cloudy tinged peritoneal fluid, without fibrinous exudates or peritonitis.   Estimated Blood Loss: 15 mL         Drains: None         Specimens: Gallbladder           Complications: none  Procedure Details  The patient was seen again in the Holding Room.  1.25 mg dose of ICG had been administered intravenously.   The benefits, complications, treatment options, risks and expected outcomes were again reviewed with the patient. The likelihood of improving the patient's symptoms with return to their baseline status is good.  The patient and/or family concurred with the proposed plan, giving informed consent, again alternatives reviewed.  The patient was taken to Operating Room, identified, and the procedure verified as robotic assisted laparoscopic cholecystectomy.  Prior to the induction of general anesthesia, antibiotic prophylaxis was administered. VTE prophylaxis was in place. General endotracheal anesthesia was then administered and tolerated well. The patient was positioned in the supine position.  After the induction, the abdomen was prepped with Chloraprep and draped in the sterile fashion.  A Time Out was held and the above information confirmed.  After local infiltration of quarter percent Marcaine with epinephrine, stab incision was made left upper quadrant.  Just below the costal margin at Palmer's point, approximately midclavicular line the Veres needle is passed with sensation of the layers to penetrate the abdominal wall and into the peritoneum.  Saline  drop test is confirmed peritoneal placement.  Insufflation is initiated with carbon dioxide to pressures of 15 mmHg.  Right para-umbilical local infiltration with quarter percent Marcaine with epinephrine is utilized.  Made a 12 mm incision on the right periumbilical site, I advanced an optical 47mm port under direct visualization into the peritoneal cavity.  Once the peritoneum was penetrated, insufflation was initiated.  The trocar was then advanced into the abdominal cavity under direct visualization. Pneumoperitoneum was then continued utilizing CO2 at 15 mmHg or less and tolerated well without any adverse changes in the patient's vital signs.  Two 8.5-mm ports were placed in the left lower quadrant and laterally, and one to the right lower quadrant, all under direct vision. All skin incisions  were infiltrated with a local anesthetic agent before making the incision and placing the trocars.  Suction was used to aspirate the peritoneal fluid to allow visualization of the RUQ.  The patient was positioned  in reverse Trendelenburg, tilted the patient's left side down.  Da Vinci XI robot was then positioned on to the patient's left side, and docked.  The gallbladder was identified, the fundus grasped via the arm 4 Prograsp and retracted cephalad. Extensive adhesions were lysed with scissors and cautery.  The infundibulum was identified grasped and retracted laterally, exposing the peritoneum overlying the triangle of Calot. This was then opened and dissected using cautery & scissors. An extended critical view of the cystic duct and cystic artery was obtained, aided by the ICG via FireFly which improved localization of the ductal anatomy.    The cystic duct was clearly identified and dissected to isolation.   And the cystic duct was triple clipped and divided with scissors, as close to the  gallbladder neck as feasible, thus leaving two on the remaining stump.  The specimen side of the artery is sealed with  bipolar and divided with monopolar scissors.   The gallbladder was taken from the gallbladder fossa in a retrograde fashion with the electrocautery. The gallbladder was removed and placed in an Endocatch bag.  The liver bed is inspected. Hemostasis was confirmed.  The robot was undocked and moved away from the operative field. 3 liters NSS irrigation was utilized and was aspirated clear.  The gallbladder and Endocatch sac were then removed through the infraumbilical port site.   Inspection of the right upper quadrant was performed. No bleeding, bile duct injury or leak, or bowel injury was noted. The infra-umbilical port site fascia was closed with interrumpted 0 Vicryl sutures under direct visualization. Pneumoperitoneum was released and ports removed.  4-0 subcuticular Monocryl was used to close the skin. Dermabond was  applied.  The patient was then extubated and brought to the recovery room in stable condition. Sponge, lap, and needle counts were correct at closure and at the conclusion of the case.               Ronny Bacon, M.D., [facility] 10/13/2022 3:43 PM

## 2022-10-13 NOTE — Anesthesia Procedure Notes (Signed)
Procedure Name: Intubation Date/Time: 10/13/2022 2:04 PM  Performed by: Doreen Salvage, CRNAPre-anesthesia Checklist: Patient identified, Patient being monitored, Timeout performed, Emergency Drugs available and Suction available Patient Re-evaluated:Patient Re-evaluated prior to induction Oxygen Delivery Method: Circle system utilized Preoxygenation: Pre-oxygenation with 100% oxygen Induction Type: IV induction Ventilation: Mask ventilation without difficulty Laryngoscope Size: Mac and 3 Grade View: Grade I Tube type: Oral Tube size: 6.5 mm Number of attempts: 1 Airway Equipment and Method: Stylet Placement Confirmation: ETT inserted through vocal cords under direct vision, positive ETCO2 and breath sounds checked- equal and bilateral Secured at: 19 cm Tube secured with: Tape Dental Injury: Teeth and Oropharynx as per pre-operative assessment

## 2022-10-13 NOTE — Progress Notes (Signed)
  PROGRESS NOTE    Makell Lacewell  [id number] DOB: [birth year] DOA: 10/12/2022 PCP: Albertina Parr, MD  [id number]/[id number]  LOS: 1 day   Brief hospital course:   Assessment & Plan: Kymiah Dauberman is a 82 y.o. female with medical history significant of asthma, hyperlipidemia, hypertension presenting with abdominal pain.    Acute gallstone pancreatitis Worsening abdominal pain over the past 3 to 4 days predominantly in periumbilical area Lipase minimally elevated in the 50s at present with also minimal elevation of AST and ALT at 60 and 92 respectively CT imaging with pancreatic head inflammation as well as cholelithiasis --cholecystectomy today --clear liquid diet --cont MIVF@125   Cholelithiasis --cholecystectomy today  Leukocytosis White count 16.8 on presentation Suspect secondary to active pancreatitis Currently afebrile Will monitor for now Reassess antibiotic coverage if patient spikes a fever  Asthma Stable   HTN (hypertension) --hold BP med   DVT prophylaxis: Lovenox SQ Code Status: Full code  Family Communication: son updated at bedside today Level of care: Med-Surg Dispo:   The patient is from: home Anticipated d/c is to: home Anticipated d/c date is: 1-2 days   Subjective and Interval History:  Son at bedside translated.  Abdominal pain controlled this morning.  Pt underwent cholecystectomy today   Objective: Vitals:   10/13/22 1600 10/13/22 1615 10/13/22 1636 10/13/22 1715  BP: (!) 116/48 (!) 111/45 (!) 98/43 (!) 112/50  Pulse: 92 92 88 88  Resp: 17 18 16 16   Temp:   97.9 F (36.6 C)   TempSrc:   Oral   SpO2: 100% 94% 96% 99%  Weight:      Height:        Intake/Output Summary (Last 24 hours) at 10/13/2022 1924 Last data filed at 10/13/2022 1550 Gross per 24 hour  Intake 1348.63 ml  Output --  Net 1348.63 ml   Filed Weights   10/12/22 1700  Weight: 38.4 kg    Examination:   Constitutional: NAD, alert,  oriented HEENT: conjunctivae and lids normal, EOMI CV: No cyanosis.   RESP: normal respiratory effort, on RA Neuro: II - XII grossly intact.   Psych: Normal mood and affect.     Data Reviewed: I have personally reviewed labs and imaging studies  Time spent: 50 minutes  Enzo Bi, MD [facility] If 7PM-7AM, please contact night-coverage 10/13/2022, 7:24 PM

## 2022-10-13 NOTE — Anesthesia Preprocedure Evaluation (Signed)
Anesthesia Evaluation  Patient identified by MRN, date of birth, ID band Patient awake    Reviewed: Allergy & Precautions, H&P , NPO status , Patient's Chart, lab work & pertinent test results, reviewed documented beta blocker date and time   History of Anesthesia Complications Negative for: history of anesthetic complications  Airway Mallampati: I  TM Distance: >3 FB Neck ROM: full    Dental  (+) Poor Dentition, Dental Advidsory Given, Missing   Pulmonary neg shortness of breath, asthma , neg COPD, neg recent URI, former smoker   Pulmonary exam normal breath sounds clear to auscultation       Cardiovascular Exercise Tolerance: Good hypertension, (-) angina (-) Past MI and (-) Cardiac Stents Normal cardiovascular exam(-) dysrhythmias (-) Valvular Problems/Murmurs Rhythm:regular Rate:Normal     Neuro/Psych negative neurological ROS  negative psych ROS   GI/Hepatic negative GI ROS, Neg liver ROS,,,  Endo/Other  negative endocrine ROS    Renal/GU negative Renal ROS  negative genitourinary   Musculoskeletal   Abdominal   Peds  Hematology negative hematology ROS (+)   Anesthesia Other Findings Past Medical History: No date: Asthma No date: HLD (hyperlipidemia) No date: Hypertension   Reproductive/Obstetrics negative OB ROS                             Anesthesia Physical Anesthesia Plan  ASA: 2  Anesthesia Plan: General   Post-op Pain Management:    Induction: Intravenous  PONV Risk Score and Plan: 3 and Ondansetron, Dexamethasone and Treatment may vary due to age or medical condition  Airway Management Planned: Oral ETT  Additional Equipment:   Intra-op Plan:   Post-operative Plan: Extubation in OR  Informed Consent: I have reviewed the patients History and Physical, chart, labs and discussed the procedure including the risks, benefits and alternatives for the proposed  anesthesia with the patient or authorized representative who has indicated his/her understanding and acceptance.     Dental Advisory Given  Plan Discussed with: Anesthesiologist, CRNA and Surgeon  Anesthesia Plan Comments:        Anesthesia Quick Evaluation

## 2022-10-13 NOTE — Consult Note (Signed)
Patient ID: Jill Abbott, female   DOB: [birth year], 82 y.o.   MRN: [id number]  Chief Complaint:  biliary pancreatitis  History of Present Illness Jill Abbott is a 82 y.o. female with 4 days h/o abdominal pain that was periumbilical with radiation to the sides bilaterally. Nausea as well as decreased p.o. intake. Had an episode of vomiting that was yellowish in color. No fevers or chills. No diarrhea.  Overnight her pain has resolved, and she feels she could eat and drink.    Past Medical History Past Medical History:  Diagnosis Date   Asthma    HLD (hyperlipidemia)    Hypertension       Past Surgical History:  Procedure Laterality Date   HERNIA REPAIR      No Known Allergies  Current Facility-Administered Medications  Medication Dose Route Frequency Provider Last Rate Last Admin   0.9 %  sodium chloride infusion   Intravenous Continuous Enzo Bi, MD 75 mL/hr at 10/13/22 0540 New Bag at 10/13/22 0540   enoxaparin (LOVENOX) injection 30 mg  30 mg Subcutaneous Q24H Deneise Lever, MD   30 mg at 10/12/22 2149   HYDROcodone-acetaminophen (NORCO/VICODIN) 5-325 MG per tablet 1-2 tablet  1-2 tablet Oral Q4H PRN Enzo Bi, MD       ondansetron [facility]) tablet 4 mg  4 mg Oral Q6H PRN Deneise Lever, MD       Or   ondansetron [facility]) injection 4 mg  4 mg Intravenous Q6H PRN Deneise Lever, MD       Oral care mouth rinse  15 mL Mouth Rinse PRN Deneise Lever, MD        Family History History reviewed. No pertinent family history.    Social History Social History   Tobacco Use   Smoking status: Former    Types: Cigarettes    Quit date: 07/16/2014    Years since quitting: 8.2   Smokeless tobacco: Never  Vaping Use   Vaping Use: Never used  Substance Use Topics   Alcohol use: No        Review of Systems  All other systems reviewed and are negative.    Physical Exam Blood pressure (!) 107/50, pulse 88, temperature 98.6 F (37 C), temperature  source Oral, resp. rate 18, height 4' 11.84" (1.52 m), weight 38.4 kg, SpO2 95 %. Last Weight  Most recent update: 10/12/2022  5:05 PM    Weight  38.4 kg (84 lb 10.5 oz)             CONSTITUTIONAL: Well developed, thin elderly female and nourished, appropriately responsive and aware without distress.  EYES: Sclera non-icteric.   EARS, NOSE, MOUTH AND THROAT:  The oropharynx is clear. Oral mucosa is pink and moist.    Hearing is intact to voice.  NECK: Trachea is midline, and there is no jugular venous distension.  LYMPH NODES:  Lymph nodes in the neck are not appreciated. RESPIRATORY:  Lungs are clear, and breath sounds are equal bilaterally.  Normal respiratory effort without pathologic use of accessory muscles. CARDIOVASCULAR: Heart is regular in rate and rhythm.   Well perfused.  GI: The abdomen is  soft, nontender, and nondistended. There were no palpable masses.  I did not appreciate hepatosplenomegaly.  MUSCULOSKELETAL:  Symmetrical muscle tone appreciated in all four extremities.    SKIN: Skin turgor is normal. No pathologic skin lesions appreciated.  NEUROLOGIC:  Motor and sensation appear grossly normal.  Cranial nerves are grossly without  defect. PSYCH:  Alert and oriented to person, place and time. Affect is appropriate for situation.  Data Reviewed I have personally reviewed what is currently available of the patient's imaging, recent labs and medical records.   Labs:     Latest Ref Rng & Units 10/13/2022    2:59 AM 10/12/2022    1:32 PM 10/15/2018    4:35 AM  CBC  WBC 4.0 - 10.5 K/uL 14.1  16.8  8.4   Hemoglobin 12.0 - 15.0 g/dL 11.1  12.5  9.5   Hematocrit 36.0 - 46.0 % 35.0  38.7  28.9   Platelets 150 - 400 K/uL 164  170  183       Latest Ref Rng & Units 10/13/2022    2:59 AM 10/12/2022    1:32 PM 10/15/2018    4:35 AM  CMP  Glucose 70 - 99 mg/dL 61  94  90   BUN 8 - 23 mg/dL 20  21  7    Creatinine 0.44 - 1.00 mg/dL 0.73  0.87  0.58   Sodium 135 - 145 mmol/L 136   132  138   Potassium 3.5 - 5.1 mmol/L 3.6  3.6  3.9   Chloride 98 - 111 mmol/L 99  96  110   CO2 22 - 32 mmol/L 23  26  22    Calcium 8.9 - 10.3 mg/dL 8.1  8.3  7.8   Total Protein 6.5 - 8.1 g/dL 5.8  7.2    Total Bilirubin 0.3 - 1.2 mg/dL 1.6  1.6    Alkaline Phos 38 - 126 U/L 81  100    AST 15 - 41 U/L 37  60    ALT 0 - 44 U/L 64  92     Lipase of 55 on 3/29.    Imaging: Radiological images reviewed:   Within last 24 hrs: US ABDOMEN LIMITED RUQ (LIVER/GB)  Result Date: 10/12/2022 CLINICAL DATA:  Right upper quadrant pain EXAM: ULTRASOUND ABDOMEN LIMITED RIGHT UPPER QUADRANT COMPARISON:  CT today FINDINGS: Gallbladder: Multiple gallstones. No gallbladder wall thickening. Small amount of pericholecystic fluid. Negative sonographic Murphy's. Common bile duct: Diameter: Normal caliber, 4 mm Liver: No focal lesion identified. Within normal limits in parenchymal echogenicity. Portal vein is patent on color Doppler imaging with normal direction of blood flow towards the liver. Other: Small amount of perihepatic ascites. IMPRESSION: Cholelithiasis. No convincing sonographic evidence of acute cholecystitis. Perihepatic ascites. Electronically Signed   By: Rolm Baptise M.D.   On: 10/12/2022 17:15   CT ABDOMEN PELVIS W CONTRAST  Result Date: 10/12/2022 CLINICAL DATA:  Acute lower abdominal pain. EXAM: CT ABDOMEN AND PELVIS WITH CONTRAST TECHNIQUE: Multidetector CT imaging of the abdomen and pelvis was performed using the standard protocol following bolus administration of intravenous contrast. RADIATION DOSE REDUCTION: This exam was performed according to the departmental dose-optimization program which includes automated exposure control, adjustment of the mA and/or kV according to patient size and/or use of iterative reconstruction technique. CONTRAST:  163mL OMNIPAQUE IOHEXOL 300 MG/ML  SOLN COMPARISON:  October 10, 2018. FINDINGS: Lower chest: No acute abnormality. Hepatobiliary: Cholelithiasis is  noted. No biliary dilatation is noted. The liver is unremarkable. Pancreas: No ductal dilatation or pseudocyst formation is noted. Mild inflammatory changes are noted around the pancreatic head suggesting pancreatitis. Spleen: Normal in size without focal abnormality. Adrenals/Urinary Tract: Adrenal glands are unremarkable. Kidneys are normal, without renal calculi, focal lesion, or hydronephrosis. Bladder is unremarkable. Stomach/Bowel: Stomach is within normal limits. Appendix appears normal.  No evidence of bowel wall thickening, distention, or inflammatory changes. Vascular/Lymphatic: No significant vascular findings are present. No enlarged abdominal or pelvic lymph nodes. Reproductive: Uterus and bilateral adnexa are unremarkable. Other: Large amount of edema is noted in the mesenteric region as well as mild ascites in the pelvis and around the liver and spleen. Musculoskeletal: Old L4 compression fracture is noted. No acute osseous abnormality is noted. IMPRESSION: Mild inflammatory changes are noted around the pancreatic head suggesting acute pancreatitis. No pseudocyst formation is noted. There does appear to be a moderate amount of mesenteric edema as well as well as mild ascites seen in the pelvis and around the liver and spleen. Cholelithiasis. Electronically Signed   By: Marijo Conception M.D.   On: 10/12/2022 15:05    Assessment     Patient Active Problem List   Diagnosis Date Noted   Leukocytosis 10/12/2022   Cholelithiasis 10/12/2022   Palliative care encounter 12/12/2018   Acute pancreatitis 10/10/2018   HTN (hypertension) 02/21/2018   HLD (hyperlipidemia) 02/21/2018   CAP (community acquired pneumonia) 02/21/2018   Asthma 02/21/2018    Plan    Robotic cholecystectomy.   This was discussed thoroughly.  Optimal plan is for robotic cholecystectomy utilizing ICG imaging. Risks and benefits have been discussed with the patient which include but are not limited to anesthesia, bleeding,  infection, biliary ductal injury, resulting in leak or stenosis, other associated unanticipated injuries affiliated with laparoscopic surgery.   Reviewed that removing the gallbladder will only address the symptoms related to the gallbladder itself.  I believe there is the desire to proceed, accepting the risks with understanding.  Questions elicited and answered to satisfaction.    Wilhemena Durie was the interpreter with her son at bedside.  # is noted on consent form.   No guarantees ever expressed or implied.   Face-to-face time spent with the patient and accompanying care providers(if present) was 45 minutes, with more than 50% of the time spent counseling, educating, and coordinating care of the patient.    These notes generated with voice recognition software. I apologize for typographical errors.  Ronny Bacon M.D., FACS 10/13/2022, 9:51 AM

## 2022-10-14 DIAGNOSIS — K851 Biliary acute pancreatitis without necrosis or infection: Secondary | ICD-10-CM | POA: Diagnosis not present

## 2022-10-14 LAB — BASIC METABOLIC PANEL
Anion gap: 9 (ref 5–15)
BUN: 28 mg/dL — ABNORMAL HIGH (ref 8–23)
CO2: 20 mmol/L — ABNORMAL LOW (ref 22–32)
Calcium: 7.5 mg/dL — ABNORMAL LOW (ref 8.9–10.3)
Chloride: 108 mmol/L (ref 98–111)
Creatinine, Ser: 0.86 mg/dL (ref 0.44–1.00)
GFR, Estimated: >60 mL/min (ref >60)
Glucose, Bld: 160 mg/dL — ABNORMAL HIGH (ref 70–99)
Potassium: 4.2 mmol/L (ref 3.5–5.1)
Sodium: 137 mmol/L (ref 135–145)

## 2022-10-14 LAB — CBC
HCT: 35 % — ABNORMAL LOW (ref 36.0–46.0)
Hemoglobin: 10.8 g/dL — ABNORMAL LOW (ref 12.0–15.0)
MCH: 27.8 pg (ref 26.0–34.0)
MCHC: 30.9 g/dL (ref 30.0–36.0)
MCV: 90.2 fL (ref 80.0–100.0)
Platelets: 194 10*3/uL (ref 150–400)
RBC: 3.88 MIL/uL (ref 3.87–5.11)
RDW: 15.2 % (ref 11.5–15.5)
WBC: 14.8 10*3/uL — ABNORMAL HIGH (ref 4.0–10.5)
nRBC: 0 % (ref 0.0–0.2)

## 2022-10-14 LAB — MAGNESIUM: Magnesium: 2.1 mg/dL (ref 1.7–2.4)

## 2022-10-14 MED ORDER — ENSURE ENLIVE PO LIQD
237.0000 mL | Freq: Two times a day (BID) | ORAL | Status: DC
  Administered 2022-10-14: 237 mL via ORAL

## 2022-10-14 MED ORDER — ADULT MULTIVITAMIN W/MINERALS CH
1.0000 | ORAL_TABLET | Freq: Every day | ORAL | Status: DC
  Administered 2022-10-14 – 2022-10-15 (×2): 1 via ORAL
  Filled 2022-10-14 (×2): qty 1

## 2022-10-14 NOTE — Progress Notes (Signed)
Initial Nutrition Assessment  DOCUMENTATION CODES:   Underweight  INTERVENTION:   -Liberalize diet to regular for wider variety of meal selections -MVI with minerals daily -Ensure Enlive po BID, each supplement provides 350 kcal and 20 grams of protein.   NUTRITION DIAGNOSIS:   Increased nutrient needs related to post-op healing as evidenced by estimated needs.  GOAL:   Patient will meet greater than or equal to 90% of their needs  MONITOR:   PO intake, Supplement acceptance  REASON FOR ASSESSMENT:   Malnutrition Screening Tool    ASSESSMENT:   Pt with medical history significant of asthma, hyperlipidemia, hypertension presenting with abdominal pain.  Pt admitted with acute gallstone pancreatitis.   3/30- s/p robotic cholecystectomy, advanced to clear liquid diet 3/31- advanced to heart healthy diet  Reviewed I/O's: +750 ml x 24 hours and +1.5 L since admission   Pt unavailable at time of visit. Attempted to speak with pt via call to hospital room phone, however, unable to reach. RD unable to obtain further nutrition-related history or complete nutrition-focused physical exam at this time.    Pt just advanced to a heart healthy diet. No meal completion data available at this time.  Reviewed wt hx; pt has experienced distant wt loss  Pt is at high risk for malnutrition, however, unable to identify at this time. Pt would benefit from addition of oral nutrition supplements.   Medications reviewed and include sodium chloride infusion @ 125 ml/hr.   Labs reviewed.   Diet Order:   Diet Order             Diet Heart Room service appropriate? Yes; Fluid consistency: Thin  Diet effective now                   EDUCATION NEEDS:   No education needs have been identified at this time  Skin:  Skin Assessment: Skin Integrity Issues: Skin Integrity Issues:: Incisions Incisions: closed abdomen  Last BM:  10/12/22  Height:   Ht Readings from Last 1 Encounters:   10/12/22 4' 11.84" (1.52 m)    Weight:   Wt Readings from Last 1 Encounters:  10/12/22 38.4 kg    Ideal Body Weight:  45.5 kg  BMI:  Body mass index is 16.62 kg/m.  Estimated Nutritional Needs:   Kcal:  1500-1700  Protein:  85-100 grams  Fluid:  > 1.5 L    Loistine Chance, RD, LDN, [place] Registered Dietitian II Certified Diabetes Care and Education Specialist Please refer to [facility] for RD and/or RD on-call/weekend/after hours pager

## 2022-10-14 NOTE — Progress Notes (Signed)
[facility] Day(s): 2.   Post op day(s): 1 Day Post-Op.   Interval History: Patient seen and examined, no acute events or new complaints overnight. Patient reports no pain, just some lingering lack of appetite.    Vital signs in last 24 hours: [min-max] current  Temp:  [97.8 F (36.6 C)-98.3 F (36.8 C)] 97.8 F (36.6 C) (03/31 0734) Pulse Rate:  [73-100] 82 (03/31 0734) Resp:  [16-22] 18 (03/31 0734) BP: (98-124)/(43-88) 122/59 (03/31 0734) SpO2:  [94 %-100 %] 100 % (03/31 0734)     Height: 4' 11.84" (152 cm) Weight: 38.4 kg BMI (Calculated): 16.62   Intake/Output last 2 shifts:  03/30 0701 - 03/31 0700 In: 750 [P.O.:200; I.V.:500; IV Piggyback:50] Out: -    Physical Exam:  Constitutional: alert, cooperative and no distress  Respiratory: breathing non-labored at rest  Cardiovascular: regular rate and sinus rhythm  Gastrointestinal: soft, non-tender, and non-distended Integumentary: incision c/d/I.   Labs:     Latest Ref Rng & Units 10/14/2022    3:14 AM 10/13/2022    2:59 AM 10/12/2022    1:32 PM  CBC  WBC 4.0 - 10.5 K/uL 14.8  14.1  16.8   Hemoglobin 12.0 - 15.0 g/dL 10.8  11.1  12.5   Hematocrit 36.0 - 46.0 % 35.0  35.0  38.7   Platelets 150 - 400 K/uL 194  164  170       Latest Ref Rng & Units 10/14/2022    3:14 AM 10/13/2022    2:59 AM 10/12/2022    1:32 PM  CMP  Glucose 70 - 99 mg/dL 160  61  94   BUN 8 - 23 mg/dL 28  20  21    Creatinine 0.44 - 1.00 mg/dL 0.86  0.73  0.87   Sodium 135 - 145 mmol/L 137  136  132   Potassium 3.5 - 5.1 mmol/L 4.2  3.6  3.6   Chloride 98 - 111 mmol/L 108  99  96   CO2 22 - 32 mmol/L 20  23  26    Calcium 8.9 - 10.3 mg/dL 7.5  8.1  8.3   Total Protein 6.5 - 8.1 g/dL  5.8  7.2   Total Bilirubin 0.3 - 1.2 mg/dL  1.6  1.6   Alkaline Phos 38 - 126 U/L  81  100   AST 15 - 41 U/L  37  60   ALT 0 - 44 U/L  64  92      Imaging studies: No new pertinent imaging  studies   Assessment/Plan:  82 y.o. female with  1 Day Post-Op s/p robotic chole for  biliary pancreatitis, and chronic calculus chole, complicated by pertinent comorbidities including:  Patient Active Problem List   Diagnosis Date Noted   Leukocytosis 10/12/2022   Cholelithiasis 10/12/2022   Palliative care encounter 12/12/2018   Acute pancreatitis 10/10/2018   HTN (hypertension) 02/21/2018   HLD (hyperlipidemia) 02/21/2018   CAP (community acquired pneumonia) 02/21/2018   Asthma 02/21/2018    - ADAT   - May d/h at per primary svc.    - repeat labs in AM.      All of the above findings and recommendations were discussed with the patient, Dr. Billie Ruddy, & family at bedside.   -- Ronny Bacon, M.D., [facility] 10/14/2022

## 2022-10-14 NOTE — Progress Notes (Signed)
  PROGRESS NOTE    Jill Abbott  [id number] DOB: 1941-07-11 DOA: 10/12/2022 PCP: Albertina Parr, MD  205A/205A-AA  LOS: 2 days   Brief hospital course:   Assessment & Plan: Jill Abbott is a 82 y.o. female with medical history significant of asthma, hyperlipidemia, hypertension presenting with abdominal pain.    Acute gallstone pancreatitis S/p cholecystectomy on 3/30 Lipase minimally elevated in the 50s at present with also minimal elevation of AST and ALT at 60 and 92 respectively CT imaging with pancreatic head inflammation as well as cholelithiasis Plan: --advance to low fat diet --cont MIVF@100   Cholelithiasis S/p cholecystectomy on 3/30  Leukocytosis White count 16.8 on presentation Suspect secondary to active pancreatitis Currently afebrile, WBC trending down --no need for abx, per surgery  Asthma Stable   HTN (hypertension) --hold BP meds   DVT prophylaxis: Lovenox SQ Code Status: Full code  Family Communication: son updated at bedside today Level of care: Med-Surg Dispo:   The patient is from: home Anticipated d/c is to: home Anticipated d/c date is: tomorrow   Subjective and Interval History:  Son at bedside translated.  Abdominal pain improved.  No back pain.   Objective: Vitals:   10/13/22 1715 10/13/22 1925 10/14/22 0437 10/14/22 0734  BP: (!) 112/50 (!) 100/51 102/88 (!) 122/59  Pulse: 88 85 73 82  Resp: 16 16 16 18   Temp:  98.3 F (36.8 C) 97.8 F (36.6 C) 97.8 F (36.6 C)  TempSrc:  Oral Oral Oral  SpO2: 99% 99% 94% 100%  Weight:      Height:        Intake/Output Summary (Last 24 hours) at 10/14/2022 1751 Last data filed at 10/13/2022 1920 Gross per 24 hour  Intake 200 ml  Output --  Net 200 ml   Filed Weights   10/12/22 1700  Weight: 38.4 kg    Examination:   Constitutional: NAD, AAOx3 HEENT: conjunctivae and lids normal, EOMI CV: No cyanosis.   RESP: normal respiratory effort, on RA Neuro: II -  XII grossly intact.     Data Reviewed: I have personally reviewed labs and imaging studies  Time spent: 35 minutes  Enzo Bi, MD [facility] If 7PM-7AM, please contact night-coverage 10/14/2022, 5:51 PM

## 2022-10-15 DIAGNOSIS — K851 Biliary acute pancreatitis without necrosis or infection: Secondary | ICD-10-CM | POA: Diagnosis not present

## 2022-10-15 LAB — BASIC METABOLIC PANEL
Anion gap: 3 — ABNORMAL LOW (ref 5–15)
BUN: 26 mg/dL — ABNORMAL HIGH (ref 8–23)
CO2: 25 mmol/L (ref 22–32)
Calcium: 7.7 mg/dL — ABNORMAL LOW (ref 8.9–10.3)
Chloride: 111 mmol/L (ref 98–111)
Creatinine, Ser: 0.74 mg/dL (ref 0.44–1.00)
GFR, Estimated: >60 mL/min (ref >60)
Glucose, Bld: 112 mg/dL — ABNORMAL HIGH (ref 70–99)
Potassium: 4.1 mmol/L (ref 3.5–5.1)
Sodium: 139 mmol/L (ref 135–145)

## 2022-10-15 LAB — CBC
HCT: 31.4 % — ABNORMAL LOW (ref 36.0–46.0)
Hemoglobin: 9.9 g/dL — ABNORMAL LOW (ref 12.0–15.0)
MCH: 28 pg (ref 26.0–34.0)
MCHC: 31.5 g/dL (ref 30.0–36.0)
MCV: 88.7 fL (ref 80.0–100.0)
Platelets: 217 10*3/uL (ref 150–400)
RBC: 3.54 MIL/uL — ABNORMAL LOW (ref 3.87–5.11)
RDW: 15.4 % (ref 11.5–15.5)
WBC: 10.5 10*3/uL (ref 4.0–10.5)
nRBC: 0 % (ref 0.0–0.2)

## 2022-10-15 LAB — MAGNESIUM: Magnesium: 2.2 mg/dL (ref 1.7–2.4)

## 2022-10-15 MED ORDER — ADULT MULTIVITAMIN W/MINERALS CH: 1.0000 | ORAL_TABLET | Freq: Every day | ORAL | Status: AC

## 2022-10-15 MED ORDER — ENSURE ENLIVE PO LIQD: 237.0000 mL | Freq: Two times a day (BID) | ORAL | 12 refills | Status: DC

## 2022-10-15 NOTE — Discharge Instructions (Signed)
In addition to included general post-operative instructions,  Diet: Resume home diet. Recommend avoiding or limiting fatty/greasy foods over the next few days/week. If you do eat these, you may (or may not) notice diarrhea. This is expected while your body adjusts to not having a gallbladder, and it typically resolves with time.    Activity: No heavy lifting >20 pounds (children, pets, laundry, garbage) or strenuous activity for 4 weeks, but light activity and walking are encouraged. Do not drive or drink alcohol if taking narcotic pain medications or having pain that might distract from driving.  Wound care: You may shower/get incision wet with soapy water and pat dry (do not rub incisions), but no baths or submerging incision underwater until follow-up.   Medications: Resume all home medications. For mild to moderate pain: acetaminophen (Tylenol) or ibuprofen/naproxen (if no kidney disease). Combining Tylenol with alcohol can substantially increase your risk of causing liver disease. Narcotic pain medications, if prescribed, can be used for severe pain, though may cause nausea, constipation, and drowsiness. Do not combine Tylenol and Percocet (or similar) within a 6 hour period as Percocet (and similar) contain(s) Tylenol. If you do not need the narcotic pain medication, you do not need to fill the prescription.  Call office [phone] / [phone]) at any time if any questions, worsening pain, fevers/chills, bleeding, drainage from incision site, or other concerns.

## 2022-10-15 NOTE — Care Management Important Message (Signed)
Important Message  Patient Details  Name: Jill Abbott MRN: [id number] Date of Birth: Jul 17, 1940   Medicare Important Message Given:  N/A - LOS <3 / Initial given by admissions     Dannette Barbara 10/15/2022, 8:41 AM

## 2022-10-15 NOTE — Discharge Summary (Signed)
Physician Discharge Summary   Jill Abbott  female DOB: [birth year]  ZOX:[id number]  PCP: Vevelyn RoyalsBiola, Holly, MD  Admit date: 10/12/2022 Discharge date: 10/15/2022  Admitted From: home Disposition:  home CODE STATUS: Full code  Discharge Instructions     Discharge instructions   Complete by: As directed    Your blood pressure was sometimes low even without blood pressure medications.  I have stopped your amlodipine and Maxzide.  Please follow up with your primary care doctor for blood pressure check.   Dr. Darlin Priestlyina Chezney [place]- -      Hospital Course:  For full details, please see H&P, progress notes, consult notes and ancillary notes.  Briefly,  Jill Abbott is a 82 y.o. female with medical history significant of asthma, hyperlipidemia, hypertension presenting with abdominal pain.    Acute gallstone pancreatitis S/p cholecystectomy on 3/30 Lipase minimally elevated in the 50s at present with also minimal elevation of AST and ALT at 60 and 92 respectively CT imaging with pancreatic head inflammation as well as cholelithiasis --pt received cholecystectomy and tolerated it well.  Pain improved and pt tolerated diet.   --outpatient f/u with [facility]   Cholelithiasis S/p cholecystectomy on 3/30   Leukocytosis White count 16.8 on presentation Suspect secondary to active pancreatitis Currently afebrile, WBC trending down --no need for abx, per surgery   Asthma Stable    HTN (hypertension) --BP low normal without home BP meds.  Home amlodipine and Maxzide d/c'ed at discharge.   Discharge Diagnoses:  Active Problems:   Acute pancreatitis   HTN (hypertension)   Asthma   Leukocytosis   Cholelithiasis   30 Day Unplanned Readmission Risk Score    Flowsheet Row ED to Hosp-Admission (Current) from [facility]  30 Day Unplanned Readmission Risk Score (%) 13.46 Filed at 10/15/2022 0401       This score is the  patient's risk of an unplanned readmission within 30 days of being discharged (0 -100%). The score is based on dignosis, age, lab data, medications, orders, and past utilization.   Low:  0-14.9   Medium: 15-21.9   High: 22-29.9   Extreme: 30 and above         Discharge Instructions:  Allergies as of 10/15/2022   No Known Allergies      Medication List     STOP taking these medications    amLODipine 5 MG tablet Commonly known as: NORVASC   guaiFENesin-dextromethorphan 100-10 MG/5ML syrup Commonly known as: ROBITUSSIN DM   triamterene-hydrochlorothiazide 37.5-25 MG tablet Commonly known as: MAXZIDE-25       TAKE these medications    aspirin EC 81 MG tablet Take 81 mg by mouth daily.   atorvastatin 20 MG tablet Commonly known as: LIPITOR Take 20 mg by mouth daily.   Calci-Chew 1250 (500 Ca) MG chewable tablet Generic drug: calcium carbonate Chew 1 tablet by mouth 2 (two) times daily.   [employer] D3 25 MCG (1000 UT) capsule Generic drug: Cholecalciferol Take 1,000 Units by mouth 2 (two) times daily.   diclofenac sodium 1 % Gel Commonly known as: VOLTAREN Apply 2 g topically 4 (four) times daily.   feeding supplement Liqd Take 237 mLs by mouth 2 (two) times daily between meals.   magnesium hydroxide 400 MG/5ML suspension Commonly known as: MILK OF MAGNESIA Take 30 mLs by mouth daily as needed for mild constipation.   multivitamin with minerals Tabs tablet Take 1 tablet by mouth daily. Over-the-counter.  Spiriva HandiHaler 18 MCG inhalation capsule Generic drug: tiotropium Place 18 mcg into inhaler and inhale daily.   Trolamine Salicylate 10 % Lotn Apply to painful area twice a day         Follow-up Information     Donovan KailSchulz, Zachary R, PA-C. Schedule an appointment as soon as possible for a visit on 10/30/2022.   Specialty: Physician Assistant Contact information: [address] [place] [address] [phone]         Vevelyn RoyalsBiola, Holly, MD  Follow up in 1 week(s).   Specialty: Family Medicine Contact information: [address] [place] [id number]                 No Known Allergies   The results of significant diagnostics from this hospitalization (including imaging, microbiology, ancillary and laboratory) are listed below for reference.   Consultations:   Procedures/Studies: US ABDOMEN LIMITED RUQ (LIVER/GB)  Result Date: 10/12/2022 CLINICAL DATA:  Right upper quadrant pain EXAM: ULTRASOUND ABDOMEN LIMITED RIGHT UPPER QUADRANT COMPARISON:  CT today FINDINGS: Gallbladder: Multiple gallstones. No gallbladder wall thickening. Small amount of pericholecystic fluid. Negative sonographic Murphy's. Common bile duct: Diameter: Normal caliber, 4 mm Liver: No focal lesion identified. Within normal limits in parenchymal echogenicity. Portal vein is patent on color Doppler imaging with normal direction of blood flow towards the liver. Other: Small amount of perihepatic ascites. IMPRESSION: Cholelithiasis. No convincing sonographic evidence of acute cholecystitis. Perihepatic ascites. Electronically Signed   By: Charlett NoseKevin  Dover M.D.   On: 10/12/2022 17:15   CT ABDOMEN PELVIS W CONTRAST  Result Date: 10/12/2022 CLINICAL DATA:  Acute lower abdominal pain. EXAM: CT ABDOMEN AND PELVIS WITH CONTRAST TECHNIQUE: Multidetector CT imaging of the abdomen and pelvis was performed using the standard protocol following bolus administration of intravenous contrast. RADIATION DOSE REDUCTION: This exam was performed according to the departmental dose-optimization program which includes automated exposure control, adjustment of the mA and/or kV according to patient size and/or use of iterative reconstruction technique. CONTRAST:  100mL OMNIPAQUE IOHEXOL 300 MG/ML  SOLN COMPARISON:  October 10, 2018. FINDINGS: Lower chest: No acute abnormality. Hepatobiliary: Cholelithiasis is noted. No biliary dilatation is noted. The liver is unremarkable.  Pancreas: No ductal dilatation or pseudocyst formation is noted. Mild inflammatory changes are noted around the pancreatic head suggesting pancreatitis. Spleen: Normal in size without focal abnormality. Adrenals/Urinary Tract: Adrenal glands are unremarkable. Kidneys are normal, without renal calculi, focal lesion, or hydronephrosis. Bladder is unremarkable. Stomach/Bowel: Stomach is within normal limits. Appendix appears normal. No evidence of bowel wall thickening, distention, or inflammatory changes. Vascular/Lymphatic: No significant vascular findings are present. No enlarged abdominal or pelvic lymph nodes. Reproductive: Uterus and bilateral adnexa are unremarkable. Other: Large amount of edema is noted in the mesenteric region as well as mild ascites in the pelvis and around the liver and spleen. Musculoskeletal: Old L4 compression fracture is noted. No acute osseous abnormality is noted. IMPRESSION: Mild inflammatory changes are noted around the pancreatic head suggesting acute pancreatitis. No pseudocyst formation is noted. There does appear to be a moderate amount of mesenteric edema as well as well as mild ascites seen in the pelvis and around the liver and spleen. Cholelithiasis. Electronically Signed   By: Lupita RaiderJames  Green Jr M.D.   On: 10/12/2022 15:05      Labs: BNP (last 3 results) No results for input(s): "BNP" in the last 8760 hours. Basic Metabolic Panel: Recent Labs  Lab 10/12/22 1332 10/13/22 0259 10/14/22 0314 10/15/22 0330  NA  132* 136 137 139  K 3.6 3.6 4.2 4.1  CL 96* 99 108 111  CO2 26 23 20* 25  GLUCOSE 94 61* 160* 112*  BUN 21 20 28* 26*  CREATININE 0.87 0.73 0.86 0.74  CALCIUM 8.3* 8.1* 7.5* 7.7*  MG  --   --  2.1 2.2   Liver Function Tests: Recent Labs  Lab 10/12/22 1332 10/13/22 0259  AST 60* 37  ALT 92* 64*  ALKPHOS 100 81  BILITOT 1.6* 1.6*  PROT 7.2 5.8*  ALBUMIN 3.3* 2.5*   Recent Labs  Lab 10/12/22 1332  LIPASE 55*   No results for input(s):  "AMMONIA" in the last 168 hours. CBC: Recent Labs  Lab 10/12/22 1332 10/13/22 0259 10/14/22 0314 10/15/22 0330  WBC 16.8* 14.1* 14.8* 10.5  NEUTROABS 14.5*  --   --   --   HGB 12.5 11.1* 10.8* 9.9*  HCT 38.7 35.0* 35.0* 31.4*  MCV 86.4 87.9 90.2 88.7  PLT 170 164 194 217   Cardiac Enzymes: No results for input(s): "CKTOTAL", "CKMB", "CKMBINDEX", "TROPONINI" in the last 168 hours. BNP: Invalid input(s): "POCBNP" CBG: No results for input(s): "GLUCAP" in the last 168 hours. D-Dimer No results for input(s): "DDIMER" in the last 72 hours. Hgb A1c No results for input(s): "HGBA1C" in the last 72 hours. Lipid Profile Recent Labs    10/12/22 1332  TRIG 79   Thyroid function studies No results for input(s): "TSH", "T4TOTAL", "T3FREE", "THYROIDAB" in the last 72 hours.  Invalid input(s): "FREET3" Anemia work up No results for input(s): "VITAMINB12", "FOLATE", "FERRITIN", "TIBC", "IRON", "RETICCTPCT" in the last 72 hours. Urinalysis    Component Value Date/Time   COLORURINE AMBER (A) 10/12/2022 1332   APPEARANCEUR HAZY (A) 10/12/2022 1332   LABSPEC 1.024 10/12/2022 1332   PHURINE 5.0 10/12/2022 1332   GLUCOSEU NEGATIVE 10/12/2022 1332   HGBUR MODERATE (A) 10/12/2022 1332   BILIRUBINUR NEGATIVE 10/12/2022 1332   KETONESUR 20 (A) 10/12/2022 1332   PROTEINUR 100 (A) 10/12/2022 1332   NITRITE NEGATIVE 10/12/2022 1332   LEUKOCYTESUR MODERATE (A) 10/12/2022 1332   Sepsis Labs Recent Labs  Lab 10/12/22 1332 10/13/22 0259 10/14/22 0314 10/15/22 0330  WBC 16.8* 14.1* 14.8* 10.5   Microbiology No results found for this or any previous visit (from the past 240 hour(s)).   Total time spend on discharging this patient, including the last patient exam, discussing the hospital stay, instructions for ongoing care as it relates to all pertinent caregivers, as well as preparing the medical discharge records, prescriptions, and/or referrals as applicable, is 30  minutes.    Darlin Priestly, MD  [facility] 10/15/2022, 8:10 AM

## 2022-10-15 NOTE — TOC Initial Note (Signed)
Transition of Care [facility]) - Initial/Assessment Note    Patient Details  Name: Jill Abbott MRN: [id number] Date of Birth: March 26, 1941  Transition of Care [facility]) CM/SW Contact:    Beverly Sessions, RN Phone Number: 10/15/2022, 9:27 AM  Clinical Narrative:                       Transition of Care [facility]) Screening Note   Patient Details  Name: Jill Abbott Date of Birth: 1941-03-06   Transition of [facility]) CM/SW Contact:    Beverly Sessions, RN Phone Number: 10/15/2022, 9:27 AM    Transition of Care Department [facility]) has reviewed patient and no TOC needs have been identified at this time. We will continue to monitor patient advancement through interdisciplinary progression rounds. If new patient transition needs arise, please place a TOC consult.     Patient Goals and CMS Choice            Expected Discharge Plan and Services         Expected Discharge Date: 10/15/22                                    Prior Living Arrangements/Services                       Activities of Daily Living Home Assistive Devices/Equipment: None ADL Screening (condition at time of admission) Patient's cognitive ability adequate to safely complete daily activities?: Yes Is the patient deaf or have difficulty hearing?: No Does the patient have difficulty seeing, even when wearing glasses/contacts?: No Does the patient have difficulty concentrating, remembering, or making decisions?: No Patient able to express need for assistance with ADLs?: Yes Does the patient have difficulty dressing or bathing?: No Independently performs ADLs?: Yes (appropriate for developmental age) Does the patient have difficulty walking or climbing stairs?: No Weakness of Legs: None Weakness of Arms/Hands: None  Permission Sought/Granted                  Emotional Assessment              Admission diagnosis:  Gallstone pancreatitis [K85.10] Acute biliary  pancreatitis, unspecified complication status [id number] Patient Active Problem List   Diagnosis Date Noted   Leukocytosis 10/12/2022   Cholelithiasis 10/12/2022   Palliative care encounter 12/12/2018   Acute pancreatitis 10/10/2018   HTN (hypertension) 02/21/2018   HLD (hyperlipidemia) 02/21/2018   CAP (community acquired pneumonia) 02/21/2018   Asthma 02/21/2018   PCP:  Albertina Parr, MD Pharmacy[id number] [place], [place] [address] Phone: [phone] Fax: [phone]     Social Determinants of Health (SDOH) Social History: SDOH Screenings   Food Insecurity: No Food Insecurity (10/12/2022)  Housing: [place]  (10/12/2022)  Transportation Needs: No Transportation Needs (10/12/2022)  Utilities: Not At Risk (10/12/2022)  Tobacco Use: Medium Risk (10/13/2022)   SDOH Interventions:     Readmission Risk Interventions     No data to display

## 2022-10-15 NOTE — Progress Notes (Signed)
Discharge instructions reviewed with the patient and her son and daughter using the hospital interpreter. IV removed. Patient will be sent out via wheelchair to her waiting ride

## 2022-10-16 LAB — SURGICAL PATHOLOGY

## 2022-10-16 NOTE — Anesthesia Postprocedure Evaluation (Signed)
Anesthesia Post Note  Patient: Denesia Rushlow  Procedure(s) Performed: XI ROBOTIC ASSISTED LAPAROSCOPIC CHOLECYSTECTOMY  Patient location during evaluation: PACU Anesthesia Type: General Level of consciousness: awake and alert Pain management: pain level controlled Vital Signs Assessment: post-procedure vital signs reviewed and stable Respiratory status: spontaneous breathing, nonlabored ventilation, respiratory function stable and patient connected to nasal cannula oxygen Cardiovascular status: blood pressure returned to baseline and stable Postop Assessment: no apparent nausea or vomiting Anesthetic complications: no   No notable events documented.   Last Vitals:  Vitals:   10/14/22 1936 10/15/22 0504  BP: 125/62 (!) 92/55  Pulse: 83 72  Resp: 16 18  Temp: 37.1 C 36.6 C  SpO2: 99% 91%    Last Pain:  Vitals:   10/15/22 1128  TempSrc:   PainSc: 3                  Martha Clan

## 2022-10-30 ENCOUNTER — Ambulatory Visit (INDEPENDENT_AMBULATORY_CARE_PROVIDER_SITE_OTHER): Payer: Medicare Other | Admitting: Physician Assistant

## 2022-10-30 ENCOUNTER — Encounter: Payer: Self-pay | Admitting: Physician Assistant

## 2022-10-30 VITALS — BP 140/62 | HR 79 | Temp 98.0°F | Ht 60.0 in | Wt 78.8 lb

## 2022-10-30 DIAGNOSIS — K8012 Calculus of gallbladder with acute and chronic cholecystitis without obstruction: Secondary | ICD-10-CM

## 2022-10-30 DIAGNOSIS — Z09 Encounter for follow-up examination after completed treatment for conditions other than malignant neoplasm: Secondary | ICD-10-CM

## 2022-10-30 DIAGNOSIS — K8010 Calculus of gallbladder with chronic cholecystitis without obstruction: Secondary | ICD-10-CM

## 2022-10-30 MED ORDER — PANTOPRAZOLE SODIUM 20 MG PO TBEC: 20.0000 mg | DELAYED_RELEASE_TABLET | Freq: Every day | ORAL | 0 refills | Status: AC

## 2022-10-30 NOTE — Progress Notes (Signed)
[facility] POST-OP OFFICE VISIT  10/30/2022  HPI: Jill Abbott is a 82 y.o. female 17 days s/p robotic assisted laparoscopic cholecystectomy for [facility] with Dr Claudine Mouton  Overall doing reasonably well Biggest complaint is epigastric pain after eating; typically a few minutes after she eats. This can last a few minutes to a few hours She is taking tylenol without improvement No fever, chills, cough, CP, SOB, nausea, emesis, or stool changes She is asking to eat "hot chilis" Incisions are well healed  Vital signs: BP (!) 140/62   Pulse 79   Temp 98 F (36.7 C)   Ht 5' (1.524 m)   Wt 78 lb 12.8 oz (35.7 kg)   SpO2 96%   BMI 15.39 kg/m    Physical Exam: Constitutional: Well appearing female, NAD Abdomen: Soft, non-tender, non-distended, no rebound/guarding Skin: Laparoscopic incisions are healing well, no erythema or drainage   Assessment/Plan: This is a 82 y.o. female 17 days s/p robotic assisted laparoscopic cholecystectomy for [facility] with Dr Claudine Mouton   - Will trial her on 14 days pantoprazole BID 20 mg to see if this improves abdominal pain with eating; question gastritis vs PUD. I do suspect this is a separate process from her GB surgery. If persistent may need GI evaluation. She also plans to see nutritionist   - Pain control prn  - Reviewed wound care recommendation  - Reviewed lifting restrictions; 4 weeks total  - Reviewed surgical pathology; CCC  - She can follow up on as needed basis; She understands to call with questions/concerns  -- Lynden Oxford, PA-C [facility] 10/30/2022, 2:53 PM M-F: 7am - 4pm

## 2022-10-30 NOTE — Patient Instructions (Signed)
Colecistectoma mnimamente invasiva, cuidados posteriores Minimally Invasive Cholecystectomy, Care After Qu puedo esperar despus del procedimiento? Despus del procedimiento, es comn [employer] siguientes sntomas: Financial risk analyst en las zonas de la [place]. Le darn medicamentos para Chief Technology Officer. Vomitar o tener nuseas. Sentir el vientre lleno (meteorismo) o Surveyor, mining en el hombro. Esto se debe al gas que se [place] durante la [place]. Siga estas instrucciones en su casa: Medicamentos Use los medicamentos de venta libre y los recetados solamente como se lo haya indicado el mdico. Si le recetaron un antibitico, tmelo como se lo haya indicado el mdico. No deje de tomarlo aunque comience a sentirse mejor. Si se lo indican, tome medidas a fin de prevenir problemas para ir de cuerpo (estreimiento). Es posible que deba hacer lo siguiente: Product manager suficiente lquido para Radio producer pis (la orina) de color amarillo plido. Tomar medicamentos. Le dirn qu medicamentos debe tomar. Comer alimentos ricos en fibra. Entre ellos, frijoles, cereales integrales y frutas y verduras frescas. Limitar los alimentos con alto contenido de grasa y International aid/development worker. Estos incluyen alimentos fritos o dulces. Pregunte al mdico si debe evitar conducir o Chemical engineer mquinas mientras toma los medicamentos. Cuidado de la incisin  Siga las instrucciones del mdico en lo que respecta al cuidado de los cortes de la ciruga (incisiones). Asegrese de hacer lo siguiente: Lvese las manos con agua y jabn durante al menos 20 segundos antes y despus de cambiarse la venda (vendaje). Use un desinfectante para manos si no dispone de [place] y [place]. Cmbiese la venda. Deje los puntos (suturas) o la goma para cerrar la piel en su lugar durante al menos 2 semanas. Deje colocadas las tiras de [place] a menos que se le indique que se las quite. Puede recortar los bordes de las tiras de cinta si se enrollan. No tome baos de inmersin, no  practique natacin ni utilice el jacuzzi. Pregunte a su mdico si puede ducharse o darse baos de esponja. Controle la zona de la incisin todos los [address]  [address] para detectar signos de infeccin. Est atento a los siguientes signos: Aumento del enrojecimiento, la hinchazn o Chief Technology Officer. Lquido o sangre. Calor. Pus o mal olor. Actividad Haga reposo como se lo haya indicado el mdico. No realice actividades que requieran mucho esfuerzo. Levntese y camine un poco cada 1 a 2 horas. Pida ayuda si se siente dbil o inestable. No levante objetos que pesen ms de 10 libras (4.5 kg) o que sean ms pesados de lo que le indicaron. No practique deportes de contacto hasta que el mdico lo autorice. No retome el trabajo ni el estudio hasta que el mdico lo autorice. Retome sus actividades normales cuando el mdico le diga que es seguro. Instrucciones generales Si le administraron un sedante durante el procedimiento, no conduzca ni use mquinas hasta que el mdico le indique que es seguro Proctor. Un sedante es un medicamento que ayuda a [place]. Concurra a todas las visitas de seguimiento. Comunquese con un mdico si: Aparece una erupcin cutnea. Aumentan el enrojecimiento, la hinchazn o el dolor alrededor de las incisiones. Le sale lquido o sangre de las incisiones. Las incisiones estn calientes al tacto. Tiene pus o percibe que sale mal olor del lugar de las incisiones. Tiene fiebre. Una o ms de las incisiones se abren. Solicite ayuda de inmediato si: Tiene dificultad para respirar. Siente dolor en el pecho. Siente dolor que empeora en la zona de los hombros. Se desmaya o se siente mareado al ponerse de pie. Tiene dolor [employer]  intenso en el vientre (abdomen). Siente que va a vomitar o vomita y esto dura ms de Civil engineer, contracting. Siente dolor en la pierna. Estos sntomas pueden Customer service manager. Solicite ayuda de inmediato. Llame al 911. No espere a ver si los sntomas desaparecen. No conduzca por sus  propios medios [employer]. Resumen Despus de la [place], es comn sentir dolor en las zonas de la [place]. Tambin puede tener vmitos o sentir llenura en el vientre. Siga las instrucciones del mdico acerca de los medicamentos, la restriccin de actividades y el cuidado de las zonas de la [place]. No realice actividades que requieran mucho esfuerzo. Comunquese con el mdico si tiene fiebre u otros signos de infeccin, como ms enrojecimiento, hinchazn o dolor alrededor Microsoft. Busque ayuda de inmediato si tiene dolor de pecho, Engineer, mining en los hombros que va en aumento o problemas para Industrial/product designer. Esta informacin no tiene Theme park manager el consejo del mdico. Asegrese de hacerle al mdico cualquier pregunta que tenga. Document Revised: 01/18/2021 Document Reviewed: 01/18/2021 Elsevier Patient Education  2023 [employer].
